# Patient Record
Sex: Female | Born: 1960 | Race: White | Hispanic: No | Marital: Married | Smoking: Current every day smoker
Health system: Southern US, Community
[De-identification: ages and names within clinical notes are randomized; demographics above are authoritative.]

## PROBLEM LIST (undated history)

## (undated) DIAGNOSIS — F329 Major depressive disorder, single episode, unspecified: Secondary | ICD-10-CM

## (undated) DIAGNOSIS — Z972 Presence of dental prosthetic device (complete) (partial): Secondary | ICD-10-CM

## (undated) DIAGNOSIS — K08109 Complete loss of teeth, unspecified cause, unspecified class: Secondary | ICD-10-CM

## (undated) DIAGNOSIS — F32A Depression, unspecified: Secondary | ICD-10-CM

## (undated) DIAGNOSIS — M549 Dorsalgia, unspecified: Secondary | ICD-10-CM

## (undated) HISTORY — DX: Major depressive disorder, single episode, unspecified: F32.9

## (undated) HISTORY — DX: Complete loss of teeth, unspecified cause, unspecified class: K08.109

## (undated) HISTORY — DX: Presence of dental prosthetic device (complete) (partial): Z97.2

## (undated) HISTORY — DX: Dorsalgia, unspecified: M54.9

## (undated) HISTORY — PX: ABDOMINAL HYSTERECTOMY: SHX81

## (undated) HISTORY — DX: Depression, unspecified: F32.A

## (undated) HISTORY — PX: WOUND DEBRIDEMENT: SHX247

---
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
## 1991-07-26 HISTORY — PX: TUBAL LIGATION: SHX77

## 1991-07-26 HISTORY — PX: BREAST ENHANCEMENT SURGERY: SHX7

## 1993-07-25 HISTORY — PX: OVARIAN CYST SURGERY: SHX726

## 1998-04-23 ENCOUNTER — Other Ambulatory Visit: Admission: RE | Admit: 1998-04-23 | Discharge: 1998-04-23 | Payer: Self-pay | Admitting: Obstetrics and Gynecology

## 1998-07-21 ENCOUNTER — Inpatient Hospital Stay (HOSPITAL_COMMUNITY): Admission: AD | Admit: 1998-07-21 | Discharge: 1998-07-22 | Payer: Self-pay | Admitting: Obstetrics and Gynecology

## 1999-05-31 ENCOUNTER — Other Ambulatory Visit: Admission: RE | Admit: 1999-05-31 | Discharge: 1999-05-31 | Payer: Self-pay | Admitting: Obstetrics and Gynecology

## 2000-01-20 ENCOUNTER — Other Ambulatory Visit: Admission: RE | Admit: 2000-01-20 | Discharge: 2000-01-20 | Payer: Self-pay | Admitting: Obstetrics and Gynecology

## 2001-03-19 ENCOUNTER — Other Ambulatory Visit: Admission: RE | Admit: 2001-03-19 | Discharge: 2001-03-19 | Payer: Self-pay | Admitting: Obstetrics and Gynecology

## 2002-05-08 ENCOUNTER — Other Ambulatory Visit: Admission: RE | Admit: 2002-05-08 | Discharge: 2002-05-08 | Payer: Self-pay | Admitting: Obstetrics and Gynecology

## 2009-02-18 ENCOUNTER — Ambulatory Visit: Payer: Self-pay | Admitting: Family Medicine

## 2009-02-18 DIAGNOSIS — F17200 Nicotine dependence, unspecified, uncomplicated: Secondary | ICD-10-CM | POA: Insufficient documentation

## 2009-02-18 DIAGNOSIS — G894 Chronic pain syndrome: Secondary | ICD-10-CM | POA: Insufficient documentation

## 2009-02-19 LAB — CONVERTED CEMR LAB
AST: 15 units/L (ref 0–37)
Albumin: 4.4 g/dL (ref 3.5–5.2)
Alkaline Phosphatase: 68 units/L (ref 39–117)
BUN: 8 mg/dL (ref 6–23)
Calcium: 9.7 mg/dL (ref 8.4–10.5)
Chloride: 105 meq/L (ref 96–112)
Potassium: 4.3 meq/L (ref 3.5–5.3)
Sodium: 140 meq/L (ref 135–145)
Total Protein: 7 g/dL (ref 6.0–8.3)

## 2009-03-03 ENCOUNTER — Encounter: Admission: RE | Admit: 2009-03-03 | Discharge: 2009-03-03 | Payer: Self-pay | Admitting: Family Medicine

## 2009-03-03 LAB — HM MAMMOGRAPHY: HM Mammogram: NEGATIVE

## 2009-03-12 ENCOUNTER — Ambulatory Visit: Payer: Self-pay | Admitting: Family Medicine

## 2009-04-03 ENCOUNTER — Ambulatory Visit: Payer: Self-pay | Admitting: Family Medicine

## 2009-05-06 ENCOUNTER — Ambulatory Visit: Payer: Self-pay | Admitting: Family Medicine

## 2009-05-15 DIAGNOSIS — E781 Pure hyperglyceridemia: Secondary | ICD-10-CM | POA: Insufficient documentation

## 2009-06-05 ENCOUNTER — Telehealth: Payer: Self-pay | Admitting: Family Medicine

## 2009-07-31 ENCOUNTER — Ambulatory Visit: Payer: Self-pay | Admitting: Family Medicine

## 2009-08-02 ENCOUNTER — Encounter: Payer: Self-pay | Admitting: Family Medicine

## 2009-11-17 ENCOUNTER — Ambulatory Visit: Payer: Self-pay | Admitting: Family Medicine

## 2009-11-17 DIAGNOSIS — F39 Unspecified mood [affective] disorder: Secondary | ICD-10-CM | POA: Insufficient documentation

## 2009-11-17 LAB — CONVERTED CEMR LAB
ALT: 12 units/L (ref 0–35)
AST: 17 units/L (ref 0–37)
Albumin: 4.4 g/dL (ref 3.5–5.2)
Alkaline Phosphatase: 59 units/L (ref 39–117)
BUN: 11 mg/dL (ref 6–23)
Calcium: 9.1 mg/dL (ref 8.4–10.5)
Chloride: 103 meq/L (ref 96–112)
Estradiol: 81.5 pg/mL
FSH: 87.2 milliintl units/mL
HDL goal, serum: 40 mg/dL
HDL: 90 mg/dL (ref >39)
Hemoglobin: 13.7 g/dL (ref 12.0–15.0)
LDL Cholesterol: 129 mg/dL — ABNORMAL HIGH (ref 0–99)
LH: 42.2 milliintl units/mL
MCHC: 34.6 g/dL (ref 30.0–36.0)
Platelets: 268 10*3/uL (ref 150–400)
Potassium: 4.7 meq/L (ref 3.5–5.3)
Progesterone: <0.2 ng/mL
RDW: 13.5 % (ref 11.5–15.5)
Sodium: 137 meq/L (ref 135–145)
TSH: 1.569 microintl units/mL (ref 0.350–4.500)
Total Protein: 7 g/dL (ref 6.0–8.3)

## 2009-11-18 ENCOUNTER — Encounter: Payer: Self-pay | Admitting: Family Medicine

## 2010-03-18 ENCOUNTER — Ambulatory Visit: Payer: Self-pay | Admitting: Family Medicine

## 2010-03-18 LAB — CONVERTED CEMR LAB
Glucose, Urine, Semiquant: NEGATIVE
Ketones, urine, test strip: NEGATIVE
Protein, U semiquant: NEGATIVE
Specific Gravity, Urine: 1.02
WBC Urine, dipstick: NEGATIVE

## 2010-03-26 ENCOUNTER — Telehealth: Payer: Self-pay | Admitting: Family Medicine

## 2010-03-30 ENCOUNTER — Ambulatory Visit: Payer: Self-pay | Admitting: Family Medicine

## 2010-07-28 ENCOUNTER — Ambulatory Visit
Admission: RE | Admit: 2010-07-28 | Discharge: 2010-07-28 | Payer: Self-pay | Source: Home / Self Care | Admitting: Emergency Medicine

## 2010-07-28 ENCOUNTER — Encounter: Payer: Self-pay | Admitting: Emergency Medicine

## 2010-07-28 DIAGNOSIS — M79609 Pain in unspecified limb: Secondary | ICD-10-CM | POA: Insufficient documentation

## 2010-07-31 ENCOUNTER — Ambulatory Visit
Admission: RE | Admit: 2010-07-31 | Discharge: 2010-07-31 | Payer: Self-pay | Source: Home / Self Care | Admitting: Family Medicine

## 2010-08-04 ENCOUNTER — Telehealth (INDEPENDENT_AMBULATORY_CARE_PROVIDER_SITE_OTHER): Payer: Self-pay | Admitting: *Deleted

## 2010-08-25 ENCOUNTER — Ambulatory Visit: Payer: Self-pay | Admitting: Family Medicine

## 2010-08-26 NOTE — Assessment & Plan Note (Signed)
Summary: FU Hip Pain   Vital Signs:  Patient profile:   50 year old female Height:      64.5 inches Weight:      135 pounds Pulse rate:   83 / minute BP sitting:   109 / 68  (right arm) Cuff size:   regular  Vitals Entered By: Avon Gully CMA, Duncan Dull) (March 30, 2010 1:56 PM) CC: f/u hip pain,not in as much pain   Primary Care Provider:  Nani Gasser MD  CC:  f/u hip pain and not in as much pain.  History of Present Illness: f/u hip pain, not in as much pain. Able to move it some. Says she accidently spilled coffe on her meds and ran out early. Also says she wore heels to a friends funeral and the next day was in worse pain.  Says the swelling is some better. Stil using ice moslty but occ using heat. Feels like it is raditing into her buttock and towads her groin.   Current Medications (verified): 1)  Cymbalta 60 Mg Cpep (Duloxetine Hcl) .... Take One Tablet By Mouth Twice A Day 2)  Clonazepam 1 Mg Tbdp (Clonazepam) .... Take One Tablet By Mouth Three Times A Day 3)  Estradiol 1 Mg Tabs (Estradiol) .... Take One Tablet By Mouth At Bedtime 4)  Trazodone Hcl 100 Mg Tabs (Trazodone Hcl) .... Take One Tablet By Mouth At Bedtime 5)  Flexeril 10 Mg Tabs (Cyclobenzaprine Hcl) .... Take 1 Tablet By Mouth Three Times A Day As Needed 6)  Hydrocodone-Acetaminophen 5-325 Mg Tabs (Hydrocodone-Acetaminophen) .Marland Kitchen.. 1-2 Tabs By Mouth Every 6 Hours As Needed For Severe Pain.  Allergies (verified): No Known Drug Allergies  Comments:  Nurse/Medical Assistant: The patient's medications and allergies were reviewed with the patient and were updated in the Medication and Allergy Lists. Avon Gully CMA, Duncan Dull) (March 30, 2010 1:56 PM)  Physical Exam  General:  Well-developed,well-nourished,in no acute distress; alert,appropriate and cooperative throughout examination Msk:  Right lateral hip with still significant swelling and brusing but overall improved. NROM and able to  ambulate today.    Impression & Recommendations:  Problem # 1:  CONTUSION, HIP, RIGHT (ICD-924.01) Improving. REfilled meds with smaller quantities. If not at least 50% better in 2 weeks let me know and will refer to ortho for further evaluation.   Complete Medication List: 1)  Cymbalta 60 Mg Cpep (Duloxetine hcl) .... Take one tablet by mouth twice a day 2)  Clonazepam 1 Mg Tbdp (Clonazepam) .... Take one tablet by mouth three times a day 3)  Estradiol 1 Mg Tabs (Estradiol) .... Take one tablet by mouth at bedtime 4)  Trazodone Hcl 100 Mg Tabs (Trazodone hcl) .... Take one tablet by mouth at bedtime 5)  Flexeril 10 Mg Tabs (Cyclobenzaprine hcl) .... Take 1 tablet by mouth three times a day as needed 6)  Hydrocodone-acetaminophen 5-325 Mg Tabs (Hydrocodone-acetaminophen) .Marland Kitchen.. 1-2 tabs by mouth every 6 hours as needed for severe pain.  Patient Instructions: 1)  Call me if not better in 2 weeks.  Prescriptions: HYDROCODONE-ACETAMINOPHEN 5-325 MG TABS (HYDROCODONE-ACETAMINOPHEN) 1-2 tabs by mouth every 6 hours as needed for severe pain.  #45 x 0   Entered and Authorized by:   Nani Gasser MD   Signed by:   Nani Gasser MD on 03/30/2010   Method used:   Printed then faxed to ...       [facility] (retail)       [address]  [place], [place]  [address]       Ph: [phone]       Fax: [phone]   RxID:   [id number] FLEXERIL 10 MG TABS (CYCLOBENZAPRINE HCL) Take 1 tablet by mouth three times a day as needed  #30 x 0   Entered and Authorized by:   Nani Gasser MD   Signed by:   Nani Gasser MD on 03/30/2010   Method used:   Printed then faxed to ...       [facility] (retail)       [address]       [place], [place]  [address]       Ph: [phone]       Fax: [phone]   RxID:   [id number]

## 2010-08-26 NOTE — Progress Notes (Signed)
Summary: pain meds  Phone Note Call from Patient   Caller: Patient Call For: Nani Gasser MD Summary of Call: Pt called and states she had been doing ok with her hip on the pain medications rx'ed. Pt states she had to "attend a girlfriends mother's funeral and had to wear heels and now her hip is hurting." Pt states " her pain level is a ten" Initial call taken by: Avon Gully CMA, Duncan Dull),  March 26, 2010 9:13 AM  Follow-up for Phone Call        Those meds were to last her at least 2 weeks adn I told her that during the OV.  Can use ice packs and rest and IBU. Lets try to get her ED notes. I would like ot see what they rx for her too. I don't see it scanned in.  Follow-up by: Nani Gasser MD,  March 26, 2010 9:44 AM  Additional Follow-up for Phone Call Additional follow up Details #1::        spoke with pt. pt states she was seen at [facility] she has been doing ice and ibuprofen but will continue to do that  Additional Follow-up by: Avon Gully CMA, Duncan Dull),  March 26, 2010 1:19 PM

## 2010-08-26 NOTE — Assessment & Plan Note (Signed)
Summary: F/U LAST VISIT NO CHANGE/TM Room 4   Vital Signs:  Patient Profile:   50 Years Old Female CC:      Rt arm pain x 5 months Height:     64.5 inches Weight:      133 pounds O2 Sat:      99 % O2 treatment:    Room Air Temp:     99.0 degrees F oral Pulse rate:   85 / minute Pulse rhythm:   regular Resp:     16 per minute BP sitting:   114 / 80  (left arm) Cuff size:   regular  Pt. in pain?   yes    Location:   arm    Intensity:   6    Type:       sharp  Vitals Entered By: Emilio Math (July 31, 2010 12:28 PM)                   Current Allergies (reviewed today): No known allergies History of Present Illness Chief Complaint: Rt arm pain x 5 months History of Present Illness:  Subjective:  Patient complains of persistent pain in her right 5th finger radiating to right elbow and even right shoulder (see previous note).  She has an appt with Dr. Oralia Rud on 08/02/10.  She did not like the effects of Nucynta which made her feel  "weird."  The pain awakens her at night. She states that she fell off her horse about 5 months ago and has had gradual increase in pain in her right shoulder and right elbow.  REVIEW OF SYSTEMS Constitutional Symptoms      Denies fever, chills, night sweats, weight loss, weight gain, and fatigue.  Eyes       Denies change in vision, eye pain, eye discharge, glasses, contact lenses, and eye surgery. Ear/Nose/Throat/Mouth       Denies hearing loss/aids, change in hearing, ear pain, ear discharge, dizziness, frequent runny nose, frequent nose bleeds, sinus problems, sore throat, hoarseness, and tooth pain or bleeding.  Respiratory       Denies dry cough, productive cough, wheezing, shortness of breath, asthma, bronchitis, and emphysema/COPD.  Cardiovascular       Denies murmurs, chest pain, and tires easily with exhertion.    Gastrointestinal       Denies stomach pain, nausea/vomiting, diarrhea, constipation, blood in bowel movements,  and indigestion. Genitourniary       Denies painful urination, kidney stones, and loss of urinary control. Neurological       Denies paralysis, seizures, and fainting/blackouts. Musculoskeletal       Complains of muscle pain and joint pain.      Denies joint stiffness, decreased range of motion, redness, swelling, muscle weakness, and gout.  Skin       Denies bruising, unusual mles/lumps or sores, and hair/skin or nail changes.  Psych       Denies mood changes, temper/anger issues, anxiety/stress, speech problems, depression, and sleep problems.  Past History:  Past Medical History: Reviewed history from 07/28/2010 and no changes required. Chronic Back Pain.   Depression  Past Surgical History: Reviewed history from 05/15/2009 and no changes required. Partial Hysterectomyfor bleeding 2000 Brest implants 1993 c/sec Surgical debridement for dog bit on her right 5th digit.  Tonsillectomy 1968 Tubal ligation 1993 Cyst removed from ovary 1995  Family History: Reviewed history from 05/15/2009 and no changes required. 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   Current Smoker Alcohol use-yes Drug use-no Regular exercise-no   Objective:  No acute distress  Right shoulder:  No deformity.  Decreased range of motion:  Has difficulty abducting above horizontal.  Decreased external rotation.  Cannot touch her bra strap behind her with right hand.  Distinct tenderness over insertions of biceps tendons. Right elbow:  No swelling or deformity.  Distinct tenderness over lateral epicondyle.  Palpation there with resisted dorsiflexion of the right wrist causes pain. Right 5th finger (see previous office visit note):  Chronic erythema distal phalanx with mild tenderness to palpation.  Decreased range of motion DIP joint. 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  #100gm x 0, 07/31/2010, Donna Christen MD  New Orders: Ketorolac-Toradol 15mg  [Z6109] Admin of Therapeutic Inj  intramuscular or subcutaneous [96372] Est. Patient Level IV [60454] Tennis Elbow Support [L3701] Planning Comments:   Given Toradol 60mg  IM.  Applied tennis elbow strap right elbow.  Rx for Voltaren Gel to apply to right 5th finger.  Ultracet for bedtime use.  Begin range of motion exercises for right shoulder and right elbow ([facility] information and instruction patient handouts given)  Follow-up with sports med clinic.   The patient and/or caregiver has been counseled thoroughly with regard to medications prescribed including dosage, schedule, interactions, rationale for use, and possible side effects and they verbalize understanding.  Diagnoses and expected course of recovery discussed and will return if not improved as expected or if the condition worsens. Patient and/or caregiver verbalized understanding.  Prescriptions: ULTRACET 37.5-325 MG TABS (TRAMADOL-ACETAMINOPHEN) 1 by mouth hs as needed pain  #10 (ten) x 0   Entered and Authorized by:   Donna Christen MD   Signed by:   Donna Christen MD on 07/31/2010   Method used:   Print then Give to Patient   RxID:   [id number] VOLTAREN 1 % GEL (DICLOFENAC SODIUM) Apply one gm to affected finger three times a day to four times a day.  #100gm x 0   Entered and Authorized by:   Donna Christen MD   Signed by:   Donna Christen MD on 07/31/2010   Method  used:   Print then Give to Patient   RxID:   [id number]   Medication Administration  Injection # 1:    Medication: Ketorolac-Toradol 15mg     Diagnosis: BICEPS TENDINITIS, RIGHT (ICD-726.12)    Route: IM    Site: LUOQ gluteus    Exp Date: 05/26/2011    Lot #: [id number]    Mfr: Novaplus    Comments: 60 mgs given    Patient tolerated injection without complications    Given by: Emilio Math (August 01, 2010 12:55 PM)  Orders Added: 1)  Ketorolac-Toradol 15mg  [J1885] 2)  Admin of Therapeutic Inj  intramuscular or subcutaneous [96372] 3)  Est. Patient Level IV [41324] 4)  Tennis Elbow Support [L3701]     Medication Administration  Injection # 1:    Medication: Ketorolac-Toradol 15mg   Diagnosis: BICEPS TENDINITIS, RIGHT (ICD-726.12)    Route: IM    Site: LUOQ gluteus    Exp Date: 05/26/2011    Lot #: [id number]    Mfr: Novaplus    Comments: 60 mgs given    Patient tolerated injection without complications    Given by: Emilio Math (August 01, 2010 12:55 PM)  Orders Added: 1)  Ketorolac-Toradol 15mg  [J1885] 2)  Admin of Therapeutic Inj  intramuscular or subcutaneous [96372] 3)  Est. Patient Level IV [04540] 4)  Tennis Elbow Support [L3701]

## 2010-08-26 NOTE — Progress Notes (Signed)
  Phone Note Outgoing Call Call back at [facility] Phone [phone]   Call placed by: Emilio Math,  August 04, 2010 2:55 PM Summary of Call: Called pt left msg

## 2010-08-26 NOTE — Letter (Signed)
Summary: CONTROLLED RX POLCY  CONTROLLED RX POLCY   Imported By: Dannette Barbara 07/28/2010 17:24:05  _____________________________________________________________________  External Attachment:    Type:   Image     Comment:   External Document

## 2010-08-26 NOTE — Assessment & Plan Note (Signed)
Summary: RT ARM PAIN,NUMBNESS,AND TINGLING/WSE   Vital Signs:  Patient Profile:   50 Years Old Female CC:      right hand and arm pain/ numbness Height:     64.5 inches Weight:      133 pounds O2 Sat:      99 % O2 treatment:    Room Air Temp:     98.2 degrees F oral Pulse rate:   78 / minute Resp:     16 per minute BP sitting:   104 / 69  (left arm) Cuff size:   regular  Pt. in pain?   yes    Location:   right hand    Intensity:   6    Type:       throbbing  Vitals Entered By: Lajean Saver RN (July 28, 2010 12:40 PM)                   Updated Prior Medication List: CYMBALTA 60 MG CPEP (DULOXETINE HCL) Take one tablet by mouth twice a day ESTRADIOL 1 MG TABS (ESTRADIOL) Take one tablet by mouth at bedtime TRAZODONE HCL 100 MG TABS (TRAZODONE HCL) Take one tablet by mouth at bedtime FLEXERIL 10 MG TABS (CYCLOBENZAPRINE HCL) Take 1 tablet by mouth three times a day as needed HYDROCODONE-ACETAMINOPHEN 5-325 MG TABS (HYDROCODONE-ACETAMINOPHEN) 1-2 tabs by mouth every 6 hours as needed for severe pain. VENTOLIN HFA 108 (90 BASE) MCG/ACT AERS (ALBUTEROL SULFATE) 2 - 4 puffs every 4 hours as needed for SOB. KLONOPIN 0.5 MG TABS (CLONAZEPAM) tid  Current Allergies: No known allergies History of Present Illness History from: patient Chief Complaint: right hand and arm pain/ numbness History of Present Illness: She was bit by a dog last spring and had to have surgery on her R pinky finger by Dr. Oralia Rud at [facility].  Ever since then, she has had terrible pain that is constant.  She stated that she is able to deal with it in the past, but now it is radiating up her entire arm and is affected her R thumb as well.  She has tried many NSAIDs but it is not helping.  Ice doesn't help.  No further injury.  She reports redness, swelling, and decreased ROM. She has a follow up appt with her Ortho next Monday but "she just can't make it until then" without something to ease the pain.  She  states that weather doesn't affect it but it causes her to cry every day.  REVIEW OF SYSTEMS Constitutional Symptoms      Denies fever, chills, night sweats, weight loss, weight gain, and fatigue.  Eyes       Denies change in vision, eye pain, eye discharge, glasses, contact lenses, and eye surgery. Ear/Nose/Throat/Mouth       Denies hearing loss/aids, change in hearing, ear pain, ear discharge, dizziness, frequent runny nose, frequent nose bleeds, sinus problems, sore throat, hoarseness, and tooth pain or bleeding.  Respiratory       Denies dry cough, productive cough, wheezing, shortness of breath, asthma, bronchitis, and emphysema/COPD.  Cardiovascular       Denies murmurs, chest pain, and tires easily with exhertion.    Gastrointestinal       Denies stomach pain, nausea/vomiting, diarrhea, constipation, blood in bowel movements, and indigestion. Genitourniary       Denies painful urination, kidney stones, and loss of urinary control. Neurological       Complains of numbness and tingling.  Denies paralysis, seizures, and fainting/blackouts.      Comments: right hand Musculoskeletal       Complains of muscle pain, joint pain, joint stiffness, and redness.      Denies decreased range of motion, swelling, muscle weakness, and gout.      Comments: right little finger and right arm Skin       Denies bruising, unusual mles/lumps or sores, and hair/skin or nail changes.  Psych       Denies mood changes, temper/anger issues, anxiety/stress, speech problems, depression, and sleep problems. Other Comments: Patient was bitten by a dog in 11/2009. She had surgery on her hand for the infection. She c/o pain in her hand that travels up her right arm. She also c/o numbness in her little finger and thumb   Past History:  Past Medical History: Chronic Back Pain.   Depression  Past Surgical History: Reviewed history from 05/15/2009 and no changes required. Partial Hysterectomyfor bleeding  2000 Brest implants 1993 c/sec Surgical debridement for dog bit on her right 5th digit.  Tonsillectomy 1968 Tubal ligation 1993 Cyst removed from ovary 1995  Family History: Reviewed history from 05/15/2009 and no changes required. 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 Spurling neg.  No TTP with ulnar nerve irritation at the elbow or wrist.  Her R pinky finger has reduced flexion at PIP and DIP.  She does have mild chronic erythema & swelling distally on her finger compared to the opposite side.  She is hesitant to participate in the examination due to her discomfort, so difficult to assess strength although NV status appears intact. Assessment New Problems: FINGER PAIN (ICD-729.5)   Plan New Medications/Changes: NUCYNTA 50 MG TABS (TAPENTADOL HCL) 1 by mouth q8 hrs as needed for severe pain  #12 x 0, 07/28/2010, Hoyt Koch MD NEURONTIN 300 MG CAPS (GABAPENTIN) 1 three times a day (titrate up to three times a day over 3 days)  #30 x 0, 07/28/2010, Hoyt Koch MD  New Orders: Est. Patient Level III [phone] Planning Comments:   She is to follow up with Dr. Oralia Rud at [facility] in [place] on Jan 9th as scheduled for more definitive treatment.  It's possible that she has some RSD symptoms from her old injury.  This could possible explain an ulnar distribution of the pain including the adductor pollicis on that same hand.  I feel that the appropriate course of action would be to first follow up with her ortho.  Afterwards, either hand therapy +/- neurology appt for EMG/NCV +/- pain  management.  According to the [facility] registry, she has received 11 narcotic Rx (total of #277 tabs) in 2011 and her behavior is somewhat suspicious for drug seeking and some of her pain may be due to underlying depression/anxiety.  Therefore will give her Nucynta for pain which shouldn't give her any narcotic high but will adequately treat pain + Rx for Neurontin to cover any nerve pain.  If her insurance doesn't cover Nucynta, will switch to Ultracet.   The patient and/or caregiver has been counseled thoroughly with regard to medications prescribed including dosage, schedule, interactions, rationale for use, and possible side  effects and they verbalize understanding.  Diagnoses and expected course of recovery discussed and will return if not improved as expected or if the condition worsens. Patient and/or caregiver verbalized understanding.  Prescriptions: NUCYNTA 50 MG TABS (TAPENTADOL HCL) 1 by mouth q8 hrs as needed for severe pain  #12 x 0   Entered and Authorized by:   Hoyt Koch MD   Signed by:   Hoyt Koch MD on 07/28/2010   Method used:   Print then Give to Patient   RxID:   [id number] NEURONTIN 300 MG CAPS (GABAPENTIN) 1 three times a day (titrate up to three times a day over 3 days)  #30 x 0   Entered and Authorized by:   Hoyt Koch MD   Signed by:   Hoyt Koch MD on 07/28/2010   Method used:   Print then Give to Patient   RxID:   [id number]   Orders Added: 1)  Est. Patient Level III [84132]

## 2010-08-26 NOTE — Assessment & Plan Note (Signed)
Summary: MEDS AND LABS   Vital Signs:  Patient profile:   50 year old female Height:      64.5 inches Weight:      139 pounds Pulse rate:   76 / minute BP sitting:   101 / 63  (left arm) Cuff size:   regular  Vitals Entered By: Kathlene November (November 17, 2009 10:22 AM) CC: needs labs done per her psychiatrist for hormone level and TSH   Primary Care Provider:  Nani Gasser MD  CC:  needs labs done per her psychiatrist for hormone level and TSH.  History of Present Illness: needs labs done per her psychiatrist for hormone level and TSH.  Started seeing psychiatrist, Dr. Shelly Coss, MD.  She upped her medications.  Was outpatient for 2 weeks at [facility] for a "mental breakdown" about 2 weeks ago.    Current Medications (verified): 1)  Cymbalta 60 Mg Cpep (Duloxetine Hcl) .... Take One Tablet By Mouth Twice A Day 2)  Clonazepam 1 Mg Tbdp (Clonazepam) .... Take One Tablet By Mouth Three Times A Day 3)  Estradiol 1 Mg Tabs (Estradiol) .... Take One Tablet By Mouth At Bedtime 4)  Trazodone Hcl 100 Mg Tabs (Trazodone Hcl) .... Take One Tablet By Mouth At Bedtime  Allergies (verified): No Known Drug Allergies  Comments:  Nurse/Medical Assistant: The patient's medications and allergies were reviewed with the patient and were updated in the Medication and Allergy Lists. Kathlene November (November 17, 2009 10:24 AM)  Physical Exam  General:  Well-developed,well-nourished,in no acute distress; alert,appropriate and cooperative throughout examination Head:  Normocephalic and atraumatic without obvious abnormalities. No apparent alopecia or balding. Lungs:  Normal respiratory effort, chest expands symmetrically. Lungs are clear to auscultation, no crackles or wheezes. Heart:  Normal rate and regular rhythm. S1 and S2 normal without gallop, murmur, click, rub or other extra sounds. Skin:  no rashes.   Psych:  Cognition and judgment appear intact. Alert and cooperative with normal attention  span and concentration. 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 Lequita Halt at [phone] (fax), [phone](phone) at [facility]. .  Also plans on seeing counselor. Will check her thyroid and hormone levels as well. Will fax copy to Dr. Lequita Halt.  She is overall doing better and seems to really like Dr. Lequita Halt.  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   Orders: T-Lipid Profile ([id number]) T-Comprehensive Metabolic Panel [phone])  Complete Medication List: 1)  Cymbalta 60 Mg Cpep (Duloxetine hcl) .... Take one tablet by mouth twice a day 2)  Clonazepam 1 Mg Tbdp (Clonazepam) .... Take one tablet by mouth three times a day 3)  Estradiol 1 Mg Tabs (Estradiol) .... Take one tablet by mouth at bedtime 4)  Trazodone Hcl 100 Mg Tabs (Trazodone hcl) .... Take one tablet by mouth at bedtime  Patient Instructions: 1)  Please schedule a follow-up appointment in 3 months .

## 2010-08-26 NOTE — Assessment & Plan Note (Signed)
Summary: was seen ED For a bruised  hip, fell off horse   Vital Signs:  Patient profile:   50 year old female Height:      64.5 inches Weight:      136 pounds BMI:     23.07 Pulse rate:   90 / minute BP sitting:   122 / 76  (left arm) Cuff size:   regular  Vitals Entered By: Avon Gully CMA, Duncan Dull) (March 18, 2010 1:36 PM) CC: fell off of  a horse tues , injured rt hip, very bruised and painful   Primary Care Nikcole Eischeid:  Nani Gasser MD  CC:  fell off of  a horse tues , injured rt hip, and very bruised and painful.  History of Present Illness: fell off of  a horse tues , injured rt hip, very bruised and painful.  Was bucked off the horse.  Landed on steel bars on a fence.  Noticed swelling immediately but was able to stand adn walk. Then tried to get in the car and drive and couldn't.  Went to [facility] adn they did xrays -these were neg for fracture.  Feel the appearance  is 80% better but still having alot of pain.  For pain control using flexeril and hydrocodone.  Says almost out of medications.  Can't sleep on that side. Has been icing it.  Worse to stand, better at rest.   Current Medications (verified): 1)  Cymbalta 60 Mg Cpep (Duloxetine Hcl) .... Take One Tablet By Mouth Twice A Day 2)  Clonazepam 1 Mg Tbdp (Clonazepam) .... Take One Tablet By Mouth Three Times A Day 3)  Estradiol 1 Mg Tabs (Estradiol) .... Take One Tablet By Mouth At Bedtime 4)  Trazodone Hcl 100 Mg Tabs (Trazodone Hcl) .... Take One Tablet By Mouth At Bedtime  Allergies (verified): No Known Drug Allergies  Comments:  Nurse/Medical Assistant: The patient's medications and allergies were reviewed with the patient and were updated in the Medication and Allergy Lists. Avon Gully CMA, Duncan Dull) (March 18, 2010 1:37 PM)  Physical Exam  General:  Well-developed,well-nourished,in no acute distress; alert,appropriate and cooperative throughout examination. Sitting in a wheelchair.  Msk:   Appears to be in significant discomfort with standing.  Flexion to about 35 degrees and extension to about 20 degrees in teh right hip before signicant pain. She has a larger hematoma that is abou 10 inches wide and it warm to touch and indurated on her right hip. Very tender to touch . Has a smaller bruise above aen below the knee on that side with a few small superficial abrasions.   Extremities:  No LE edema.    Impression & Recommendations:  Problem # 1:  CONTUSION, HIP, RIGHT (ICD-924.01) Discused dx with patient that this is a soft tissue injury and likely will take 4 weeks to heel. Did refill her pain  meds for now and will get notes form [facility] ED. F/U in 2 weeks to make sure she is healing well and doesn' tneed repeat hip xrays. Encouraged her to cotinugin icing the area for a few more days adn then move to heat for comfort. Work on ROM.   Problem # 2:  URINARY FREQUENCY (ICD-788.41) Some frequency the last few days with no hematuria, dysuria or abdominal pain.  UA is neg. Follow for new sxs.  Orders: UA Dipstick w/o Micro (automated)  (81003)  Complete Medication List: 1)  Cymbalta 60 Mg Cpep (Duloxetine hcl) .... Take one tablet by mouth twice  a day 2)  Clonazepam 1 Mg Tbdp (Clonazepam) .... Take one tablet by mouth three times a day 3)  Estradiol 1 Mg Tabs (Estradiol) .... Take one tablet by mouth at bedtime 4)  Trazodone Hcl 100 Mg Tabs (Trazodone hcl) .... Take one tablet by mouth at bedtime 5)  Flexeril 10 Mg Tabs (Cyclobenzaprine hcl) .... Take 1 tablet by mouth three times a day as needed 6)  Hydrocodone-acetaminophen 5-325 Mg Tabs (Hydrocodone-acetaminophen) .Marland Kitchen.. 1-2 tabs by mouth every 6 hours as needed for severe pain.  Patient Instructions: 1)  REfilled your pain medications and your muscle relaxer.   2)  Please schedule a follow-up appointment in 2 weeks to recheck your hip.  Prescriptions: HYDROCODONE-ACETAMINOPHEN 5-325 MG TABS (HYDROCODONE-ACETAMINOPHEN) 1-2 tabs  by mouth every 6 hours as needed for severe pain.  #60 x 0   Entered and Authorized by:   Nani Gasser MD   Signed by:   Nani Gasser MD on 03/18/2010   Method used:   Printed then faxed to ...       [facility] (retail)       [address]       [place], [place]  [address]       Ph: [phone]       Fax: [phone]   RxID:   [id number] FLEXERIL 10 MG TABS (CYCLOBENZAPRINE HCL) Take 1 tablet by mouth three times a day as needed  #45 x 0   Entered and Authorized by:   Nani Gasser MD   Signed by:   Nani Gasser MD on 03/18/2010   Method used:   Printed then faxed to ...       [facility] (retail)       [address]       [place], [place]  [address]       Ph: [phone]       Fax: [phone]   RxID:   [id number]   Laboratory Results   Urine Tests  Date/Time Received: 03/18/2010 Date/Time Reported: 03/18/2010  Routine Urinalysis   Color: yellow Appearance: Clear Glucose: negative   (Normal Range: Negative) Bilirubin: negative   (Normal Range: Negative) Ketone: negative   (Normal Range: Negative) Spec. Gravity: 1.020   (Normal Range: 1.003-1.035) Blood: negative   (Normal Range: Negative) pH: 7.0   (Normal Range: 5.0-8.0) Protein: negative   (Normal Range: Negative) Urobilinogen: 0.2   (Normal Range: 0-1) Nitrite: negative   (Normal Range: Negative) Leukocyte Esterace: negative   (Normal Range: Negative)

## 2010-08-26 NOTE — Assessment & Plan Note (Signed)
Summary: Burn behind ear x 2 weeks rm 3   Vital Signs:  Patient Profile:   50 Years Old Female CC:      Burn behind ear x 2 weeks - infected Height:     64.5 inches Weight:      142 pounds O2 Sat:      100 % O2 treatment:    Room Air Temp:     97.2 degrees F oral Pulse rate:   66 / minute Pulse rhythm:   regular Resp:     16 per minute BP sitting:   113 / 70  (right arm) Cuff size:   regular  Vitals Entered By: Areta Haber CMA (July 31, 2009 2:34 PM)                  Current Allergies: No known allergies History of Present Illness Chief Complaint: Burn behind ear x 2 weeks - infected History of Present Illness: Subjective:  Patient complains of a burn behind her right ear about 2 weeks ago from a heating pad.  She has had persistent pain and swelling in the area now with soreness in her right neck.  She has had small amounts of dreainage from the lesion.  The pain is worse at night.  No fevers, chills, and sweats.  She feels well.  Current Problems: CELLULITIS, SCALP (ICD-682.8) HYPERTRIGLYCERIDEMIA (ICD-272.1) TOBACCO ABUSE (ICD-305.1) CHRONIC PAIN SYNDROME (ICD-338.4)   Current Meds CYMBALTA 60 MG CPEP (DULOXETINE HCL) Take one tablet by mouth once a day CLONAZEPAM 1 MG TBDP (CLONAZEPAM) Take one tablet by mouth three times a day ESTRADIOL 1 MG TABS (ESTRADIOL) Take one tablet by mouth at bedtime MELOXICAM 15 MG TABS (MELOXICAM) Take one tablet by mouth oncea day as needed FLEXERIL 10 MG TABS (CYCLOBENZAPRINE HCL) take one tablet by mouth at bedtime HYDROCODONE-ACETAMINOPHEN 5-500 MG TABS (HYDROCODONE-ACETAMINOPHEN) Take one tablet by mouth three times a day as needed PREVACID 15 MG CPDR (LANSOPRAZOLE) Take one tablet by mouth once a day L-ARGININE 1000 MG TABS (ARGININE) Take one tablet by mouth once a day ZINC 50 MG TABS (ZINC) Take one tablet by mouth once a day MULTIVITAMINS  CAPS (MULTIPLE VITAMIN) Take one tablet by mouth once a day VITAMIN C 500 MG  TABS (ASCORBIC ACID) Take one tablet by mouth once a day MINOCYCLINE HCL 100 MG CAPS (MINOCYCLINE HCL) 1 by mouth two times a day DIFLUCAN 150 MG TABS (FLUCONAZOLE) One by mouth as a single dose LORTAB 5 5-500 MG TABS (HYDROCODONE-ACETAMINOPHEN) One or two tabs by mouth hs as needed pain  REVIEW OF SYSTEMS Constitutional Symptoms      Denies fever, chills, night sweats, weight loss, weight gain, and fatigue.  Eyes       Denies change in vision, eye pain, eye discharge, glasses, contact lenses, and eye surgery. Ear/Nose/Throat/Mouth       Denies hearing loss/aids, change in hearing, ear pain, ear discharge, dizziness, frequent runny nose, frequent nose bleeds, sinus problems, sore throat, hoarseness, and tooth pain or bleeding.  Respiratory       Denies dry cough, productive cough, wheezing, shortness of breath, asthma, bronchitis, and emphysema/COPD.  Cardiovascular       Denies murmurs, chest pain, and tires easily with exhertion.    Gastrointestinal       Denies stomach pain, nausea/vomiting, diarrhea, constipation, blood in bowel movements, and indigestion. Genitourniary       Denies painful urination, kidney stones, and loss of urinary control. Neurological  Denies paralysis, seizures, and fainting/blackouts. Musculoskeletal       Denies muscle pain, joint pain, joint stiffness, decreased range of motion, redness, swelling, muscle weakness, and gout.  Skin       Denies bruising, unusual mles/lumps or sores, and hair/skin or nail changes.      Comments: burn behind ear x wks Psych       Denies mood changes, temper/anger issues, anxiety/stress, speech problems, depression, and sleep problems. Other Comments: Pt has not seen PCP for this.    Objective:  No acute distress  Eyes:  Pupils are equal, round, and reactive to light and accomdation.  Extraocular movement is intact.  Conjunctivae are not inflamed.  Ears:  Canals normal.  Tympanic membranes normal.   Nose:  No sinus  tenderness Pharynx:  Normal  Head:  2cm by 3cm erythematous tender lesion behind her right ear with central eschar.  Not fluctuant.  No drainage noted Neck:  Tender shotty right post-auricular and posterior cervical nodes. Assessment New Problems: CELLULITIS, SCALP (ICD-682.8)   Plan New Medications/Changes: LORTAB 5 5-500 MG TABS (HYDROCODONE-ACETAMINOPHEN) One or two tabs by mouth hs as needed pain  #12 (twelve) x 0, 07/31/2009, Donna Christen MD DIFLUCAN 150 MG TABS (FLUCONAZOLE) One by mouth as a single dose  #1 x 0, 07/31/2009, Donna Christen MD MINOCYCLINE HCL 100 MG CAPS (MINOCYCLINE HCL) 1 by mouth two times a day  #20 x 0, 07/31/2009, Donna Christen MD  New Orders: T-Culture, Wound [[id number]] New Patient Level III [30865] Planning Comments:   Wound culture taken.  Begin Minocycline.  May take Diflucan if vaginal yeast infection develops.  Analgesic for bedtime.  May take two Aleve tabs two times a day  Return for worsening symptoms or if not resolved 7 to 10 days.   The patient and/or caregiver has been counseled thoroughly with regard to medications prescribed including dosage, schedule, interactions, rationale for use, and possible side effects and they verbalize understanding.  Diagnoses and expected course of recovery discussed and will return if not improved as expected or if the condition worsens. Patient and/or caregiver verbalized understanding.  Prescriptions: LORTAB 5 5-500 MG TABS (HYDROCODONE-ACETAMINOPHEN) One or two tabs by mouth hs as needed pain  #12 (twelve) x 0   Entered and Authorized by:   Donna Christen MD   Signed by:   Donna Christen MD on 07/31/2009   Method used:   Print then Give to Patient   RxID:   [id number] DIFLUCAN 150 MG TABS (FLUCONAZOLE) One by mouth as a single dose  #1 x 0   Entered and Authorized by:   Donna Christen MD   Signed by:   Donna Christen MD on 07/31/2009   Method used:   Print then Give to Patient   RxID:    [id number] MINOCYCLINE HCL 100 MG CAPS (MINOCYCLINE HCL) 1 by mouth two times a day  #20 x 0   Entered and Authorized by:   Donna Christen MD   Signed by:   Donna Christen MD on 07/31/2009   Method used:   Print then Give to Patient   RxID:   [id number]

## 2010-09-09 ENCOUNTER — Telehealth: Payer: Self-pay | Admitting: Family Medicine

## 2010-09-21 NOTE — Progress Notes (Addendum)
Summary: [facility]-Smoking cessation program  Phone Note Call from Patient Call back at Home Phone [phone]   Caller: Patient Call For: Nani Gasser MD Summary of Call: pt has questions about smoking cessation program.  LM to [facility] at home number   [facility], classes and support groups, search by Tobacco Cessation in Category call [phone] or [phone] Initial call taken by: Francee Piccolo CMA Duncan Dull),  September 09, 2010 3:19 PM  Follow-up for Phone Call        LM to [facility] at home number Francee Piccolo CMA Duncan Dull)  September 14, 2010 10:29 AM   No return call, ending follow up. Follow-up by: Francee Piccolo CMA Duncan Dull),  September 17, 2010 4:39 PM

## 2010-09-30 ENCOUNTER — Encounter: Payer: Self-pay | Admitting: Family Medicine

## 2010-09-30 ENCOUNTER — Ambulatory Visit (INDEPENDENT_AMBULATORY_CARE_PROVIDER_SITE_OTHER): Payer: BC Managed Care – PPO | Admitting: Family Medicine

## 2010-09-30 DIAGNOSIS — J209 Acute bronchitis, unspecified: Secondary | ICD-10-CM

## 2010-10-01 ENCOUNTER — Telehealth: Payer: Self-pay | Admitting: Family Medicine

## 2010-10-05 NOTE — Progress Notes (Signed)
Summary: Cough med  Phone Note Call from Patient   Caller: Patient Call For: Nani Gasser MD Summary of Call: Pt called and statse she did not sleep at all last pm because of coughing and wants cough med called into pharm Initial call taken by: Avon Gully CMA, Duncan Dull),  October 01, 2010 2:59 PM  Follow-up for Phone Call        Rx Called In Follow-up by: Nani Gasser MD,  October 01, 2010 3:15 PM    New/Updated Medications: TESSALON PERLES 100 MG CAPS (BENZONATATE) one by mouth up to three times a day as needed cough Prescriptions: TESSALON PERLES 100 MG CAPS (BENZONATATE) one by mouth up to three times a day as needed cough  #45 x 0   Entered and Authorized by:   Nani Gasser MD   Signed by:   Nani Gasser MD on 10/01/2010   Method used:   Electronically to        [facility]* (retail)       [address]       [place], [place]  [address]       Ph: [phone]       Fax: [phone]   RxID:   [id number]

## 2010-10-05 NOTE — Assessment & Plan Note (Signed)
Summary: Acute bronchitis, wants to quit smoking   Vital Signs:  Patient profile:   50 year old female Height:      64.5 inches Weight:      133 pounds O2 Sat:      99 % on Room air Temp:     98.2 degrees F oral Pulse rate:   73 / minute BP sitting:   114 / 79  (right arm) Cuff size:   regular  Vitals Entered By: Avon Gully CMA, Duncan Dull) (September 30, 2010 9:40 AM)  O2 Flow:  Room air  Serial Vital Signs/Assessments:                                PEF    PreRx  PostRx Time      O2 Sat  O2 Type     L/min  L/min  L/min   By 11:36 AM                      230    230    200     Andrea McCrimmon CMA, (AAMA)  Comments: 11:36 AM pt is in the yellow zone By: Avon Gully CMA, (AAMA)   CC: coughing and fatige,congestion   Primary Care Provider:  Nani Gasser MD  CC:  coughing and fatige and congestion.  History of Present Illness: Start feeling bad about 6 days ago. Runny nose.  Cough and now lower lungs have been hurting. Has been wheezing at night.  Very dry cough.  + chills.  Earache. No nasal congestion. NO ST ot GI sxs. No OTC meds.   She would like to quit smoking. She would like to quit on her 50th birthday.  She brought a booklet from [employer].  She is interested in the nicotine inhaler. Will need a rx for it.  She has tried to quit before with teh program but didnt get very far. Her spouse doesn't smoke.   Current Medications (verified): 1)  Cymbalta 60 Mg Cpep (Duloxetine Hcl) .... Take One Tablet By Mouth Twice A Day 2)  Estradiol 1 Mg Tabs (Estradiol) .... Take One Tablet By Mouth At Bedtime 3)  Trazodone Hcl 100 Mg Tabs (Trazodone Hcl) .... Take One Tablet By Mouth At Bedtime 4)  Klonopin 0.5 Mg Tabs (Clonazepam) .... Tid 5)  Neurontin 300 Mg Caps (Gabapentin) .Marland Kitchen.. 1 Three Times A Day (Titrate Up To Three Times A Day Over 3 Days)  Allergies (verified): No Known Drug Allergies  Comments:  Nurse/Medical Assistant: The patient's medications and  allergies were reviewed with the patient and were updated in the Medication and Allergy Lists. 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   Current Smoker Alcohol use-yes Drug use-no Regular exercise-no  Physical Exam  General:  Well-developed,well-nourished,in no acute distress; alert,appropriate and cooperative throughout examination Head:  Normocephalic and atraumatic without obvious abnormalities. No apparent alopecia or balding. Eyes:  No corneal or conjunctival inflammation noted. EOMI. Perrla. Ears:  External ear exam shows no significant lesions or deformities.  Otoscopic examination reveals clear canals, tympanic membranes are intact bilaterally without bulging, retraction, inflammation or discharge. Hearing is grossly normal bilaterally. Nose:  External nasal examination shows no deformity or inflammation. Nasal mucosa are pink and moist without lesions or exudates. Mouth:  Oral mucosa and oropharynx without lesions  or exudates.  Teeth in good repair. Neck:  No deformities, masses, or tenderness noted. Lungs:  Expiratory wheezing.  GOod effort. No crackles.   Heart:  Normal rate and regular rhythm. S1 and S2 normal without gallop, murmur, click, rub or other extra sounds. Skin:  no rashes.   Cervical Nodes:  No lymphadenopathy noted Psych:  Cognition and judgment appear intact. Alert and cooperative with normal attention span and concentration. No apparent delusions, illusions, hallucinations   Impression & Recommendations:  Problem # 1:  BRONCHITIS- ACUTE (ICD-466.0)  I suspect she underlying COPD. Her peak flow was in the yellow zone today. Given NEB tx here in the office Will treat with 10 days of doxy. ONce better, schedule for spirometry for further evaluation.  Her updated medication list for this problem includes:    Doxycycline Hyclate 100 Mg Tabs (Doxycycline  hyclate) .Marland Kitchen... Take 1 tablet by mouth two times a day for 10 days    Ventolin Hfa 108 (90 Base) Mcg/act Aers (Albuterol sulfate) .Marland Kitchen... 2 puffs inhaled three times a day for shortness of breath or wheezing  Orders: Peak Flow Rate ([address]) Nebulizer Tx ([address]) Albuterol Sulfate Sol 1mg  unit dose (U0454)  Problem # 2:  TOBACCO ABUSE (ICD-305.1) Dsicused smoking cessation. It hinkthe [facility] program is great. She  is going to talk wiht a rep there and decide which nicotine replacement method she would like and call me me back.   Complete Medication List: 1)  Cymbalta 60 Mg Cpep (Duloxetine hcl) .... Take one tablet by mouth twice a day 2)  Estradiol 1 Mg Tabs (Estradiol) .... Take one tablet by mouth at bedtime 3)  Trazodone Hcl 100 Mg Tabs (Trazodone hcl) .... Take one tablet by mouth at bedtime 4)  Klonopin 0.5 Mg Tabs (Clonazepam) .... Tid 5)  Neurontin 300 Mg Caps (Gabapentin) .Marland Kitchen.. 1 three times a day (titrate up to three times a day over 3 days) 6)  Doxycycline Hyclate 100 Mg Tabs (Doxycycline hyclate) .... Take 1 tablet by mouth two times a day for 10 days 7)  Ventolin Hfa 108 (90 Base) Mcg/act Aers (Albuterol sulfate) .... 2 puffs inhaled three times a day for shortness of breath or wheezing  Patient Instructions: 1)  When you are feelikng better let do spirometry( lung test) to check out the shape of your lungs with your years of smoking. 2)  Complete your antibiotic 3)  Call me if you decide you want a rx for nicotine replacment.  Prescriptions: VENTOLIN HFA 108 (90 BASE) MCG/ACT AERS (ALBUTEROL SULFATE) 2 puffs inhaled three times a day for shortness of breath or wheezing  #1 x 0   Entered and Authorized by:   Nani Gasser MD   Signed by:   Nani Gasser MD on 09/30/2010   Method used:   Electronically to        [facility] (retail)       [address]       [place], [place]  [address]       Ph: [phone]       Fax: [phone]   RxID:   [id number] DOXYCYCLINE  HYCLATE 100 MG TABS (DOXYCYCLINE HYCLATE) Take 1 tablet by mouth two times a day for 10 days  #20 x 0   Entered and Authorized by:   Nani Gasser MD   Signed by:   Nani Gasser MD on 09/30/2010   Method used:   Electronically to        [facility]* (retail)  [address]       [place], [place]  [address]       Ph: [phone]       Fax: [phone]   RxID:   [id number]    Medication Administration  Medication # 1:    Medication: Albuterol Sulfate Sol 1mg  unit dose    Diagnosis: BRONCHITIS- ACUTE (ICD-466.0)    Dose: 2.5    Route: inhaled    Patient tolerated medication without complications    Given by: Avon Gully CMA, Duncan Dull) (September 30, 2010 11:37 AM)  Orders Added: 1)  Est. Patient Level IV [84166] 2)  Peak Flow Rate [94150] 3)  Nebulizer Tx [94640] 4)  Albuterol Sulfate Sol 1mg  unit dose [A6301]

## 2010-10-14 ENCOUNTER — Telehealth: Payer: Self-pay | Admitting: *Deleted

## 2010-10-14 DIAGNOSIS — Z720 Tobacco use: Secondary | ICD-10-CM

## 2010-10-14 MED ORDER — NICOTINE 10 MG IN INHA: 1.0000 | RESPIRATORY_TRACT | Status: AC | PRN

## 2010-10-14 NOTE — Telephone Encounter (Signed)
Put in rx but didn't have pharmacy so printed it.

## 2010-10-14 NOTE — Telephone Encounter (Signed)
Pt states she spoke w/ you at last OV about smoking cessation and now is requesting Nicotrol Cartridge Inhaler. Please advise.

## 2010-11-02 ENCOUNTER — Other Ambulatory Visit: Payer: Self-pay | Admitting: Family Medicine

## 2010-12-14 ENCOUNTER — Other Ambulatory Visit: Payer: Self-pay | Admitting: Family Medicine

## 2010-12-20 ENCOUNTER — Inpatient Hospital Stay (INDEPENDENT_AMBULATORY_CARE_PROVIDER_SITE_OTHER)
Admission: RE | Admit: 2010-12-20 | Discharge: 2010-12-20 | Disposition: A | Discharge status: Home / Self Care | Payer: BC Managed Care – PPO | Source: Ambulatory Visit | Attending: Emergency Medicine | Admitting: Emergency Medicine

## 2010-12-20 ENCOUNTER — Encounter: Payer: Self-pay | Admitting: Emergency Medicine

## 2010-12-20 DIAGNOSIS — H9209 Otalgia, unspecified ear: Secondary | ICD-10-CM

## 2010-12-21 ENCOUNTER — Encounter: Payer: Self-pay | Admitting: Family Medicine

## 2010-12-23 ENCOUNTER — Ambulatory Visit (INDEPENDENT_AMBULATORY_CARE_PROVIDER_SITE_OTHER): Payer: BC Managed Care – PPO | Admitting: Family Medicine

## 2010-12-23 ENCOUNTER — Encounter: Payer: Self-pay | Admitting: Family Medicine

## 2010-12-23 DIAGNOSIS — R4586 Emotional lability: Secondary | ICD-10-CM

## 2010-12-23 DIAGNOSIS — R102 Pelvic and perineal pain: Secondary | ICD-10-CM

## 2010-12-23 DIAGNOSIS — Z0000 Encounter for general adult medical examination without abnormal findings: Secondary | ICD-10-CM

## 2010-12-23 DIAGNOSIS — F39 Unspecified mood [affective] disorder: Secondary | ICD-10-CM

## 2010-12-23 DIAGNOSIS — N912 Amenorrhea, unspecified: Secondary | ICD-10-CM

## 2010-12-23 DIAGNOSIS — Z1231 Encounter for screening mammogram for malignant neoplasm of breast: Secondary | ICD-10-CM

## 2010-12-23 DIAGNOSIS — N949 Unspecified condition associated with female genital organs and menstrual cycle: Secondary | ICD-10-CM

## 2010-12-23 DIAGNOSIS — Z1211 Encounter for screening for malignant neoplasm of colon: Secondary | ICD-10-CM

## 2010-12-23 NOTE — Patient Instructions (Signed)
WE will call you with your lab results as soon as we get them back

## 2010-12-23 NOTE — Progress Notes (Signed)
Subjective:    Patient ID: Briana Kim, female    DOB: 14-Jul-1961, 50 y.o.   MRN: [id number]  HPI She is having mood swings and irritability.  She says about a week before she ovulates she feels great and then she ovulates and then she cries and is irritable for 1-2 weeks.  She says even her husband is wanting her to do something about it.  No hot flashes.  She went to [facility] on Monday for an ear ache.  She has TMJ, went to the dentist yesterday.  She has been on ABX for her gums.  She feels swollen on the left side of her neck.  Hasn't been able to wear her dentures. She would like to get her hormones checked.  She is also having R and left lower quadrant program. She is worried she has ovarian cancer.  She has had an hysterectomy.    She is due for her mammogram.  She is due for her colonoscopy.    Review of Systems     Objective:   Physical Exam    She does have a slightly swollen LN on the left side of the neck. No clicking or popping of the TMG.      Assessment & Plan:   Subjective:     Briana Kim is a 50 y.o. female and is here for a comprehensive physical exam. The patient reports see above. Marland Kitchen  History   Social History  . Marital Status: Married    Spouse Name: N/A    Number of Children: N/A  . Years of Education: N/A   Occupational History  . OWNER     Dog groomer   Social History Main Topics  . Smoking status: Current Everyday Smoker  . Smokeless tobacco: Not on file  . Alcohol Use: Yes  . Drug Use: No  . Sexually Active:      dog groomer, owns business, doesn't regularly exercise.   Other Topics Concern  . Not on file   Social History Narrative  . No narrative on file   Health Maintenance  Topic Date Due  . Colonoscopy  11/01/2010  . Mammogram  11/01/2010  . Influenza Vaccine  04/25/2011  . Tetanus/tdap  10/02/2014    The following portions of the patient's history were reviewed and updated as appropriate: allergies, current medications, past family  history, past medical history, past social history and past surgical history.  Review of Systems Pertinent items are noted in HPI.   Objective:    BP 126/77  Pulse 92  Ht 5\' 6"  (1.676 m)  Wt 133 lb (60.328 kg)  BMI 21.47 kg/m2  SpO2 96% General appearance: alert, cooperative and appears stated age Head: Normocephalic, without obvious abnormality, atraumatic Eyes: PEERLA, EOMI, conjuctiva are clear.  Ears: normal TM's and external ear canals both ears Nose: Nares normal. Septum midline. Mucosa normal. No drainage or sinus tenderness. Throat: lips, mucosa, and tongue normal; teeth and gums normal Neck: no adenopathy, no carotid bruit, supple, symmetrical, trachea midline and thyroid not enlarged, symmetric, no tenderness/mass/nodules Back: symmetric, no curvature. ROM normal. No CVA tenderness. Lungs: clear to auscultation bilaterally Breasts: normal appearance, no masses or tenderness Heart: regular rate and rhythm, S1, S2 normal, no murmur, click, rub or gallop Abdomen: soft, non-tender; bowel sounds normal; no masses,  no organomegaly Pelvic: external genitalia normal, no adnexal masses or tenderness, rectovaginal septum normal and vagina normal without discharge Extremities: extremities normal, atraumatic, no cyanosis or edema Pulses: 2+ and symmetric  Skin: Skin color, texture, turgor normal. No rashes or lesions Lymph nodes: Cervical, supraclavicular, and axillary nodes normal. Neurologic: Grossly normal    Assessment:    Healthy female exam. She is very emotional with her ovulation.     Plan:     See After Visit Summary for Counseling Recommendations   Due for screening labs Regular exercise. She is smoking again. Discussed the importance of cessation.  I suspect she is perimenopausal since she is having emotional lability around the time of her periods. We will check her hormone levels and consider HRT. She is already on psych meds and sees psych. She is interested  in me taking over her meds. I explained that I will be happy to do this once she is on a stable regimen and I have notes form her psych.   Schedule mammogram.  Schedule colonoscopy.  Her right breath may have a leaking implant. She says it is saline. Will start with mammogram but may need to get a further evaluation.

## 2010-12-27 ENCOUNTER — Telehealth: Payer: Self-pay | Admitting: Family Medicine

## 2010-12-27 DIAGNOSIS — Z1231 Encounter for screening mammogram for malignant neoplasm of breast: Secondary | ICD-10-CM

## 2010-12-27 NOTE — Telephone Encounter (Signed)
[facility] called and pt had an order entered into "EPIC' and it was sent as screen mammo.  It needs to be changed to digital screen with implants due to the fact that pt has breast implants. Plan:  Routed to provider Dr. Marlyne Beards, LPN [facility]

## 2010-12-27 NOTE — Telephone Encounter (Signed)
Order corrected

## 2010-12-30 ENCOUNTER — Other Ambulatory Visit: Payer: Self-pay | Admitting: Family Medicine

## 2010-12-30 DIAGNOSIS — R102 Pelvic and perineal pain: Secondary | ICD-10-CM

## 2011-01-18 ENCOUNTER — Ambulatory Visit: Payer: BC Managed Care – PPO

## 2011-01-18 ENCOUNTER — Other Ambulatory Visit: Payer: BC Managed Care – PPO

## 2011-01-18 ENCOUNTER — Ambulatory Visit
Admission: RE | Admit: 2011-01-18 | Discharge: 2011-01-18 | Disposition: A | Discharge status: Home / Self Care | Payer: BC Managed Care – PPO | Source: Ambulatory Visit | Attending: Family Medicine | Admitting: Family Medicine

## 2011-01-18 ENCOUNTER — Telehealth: Payer: Self-pay | Admitting: Family Medicine

## 2011-01-18 DIAGNOSIS — Z1231 Encounter for screening mammogram for malignant neoplasm of breast: Secondary | ICD-10-CM

## 2011-01-18 DIAGNOSIS — R102 Pelvic and perineal pain: Secondary | ICD-10-CM

## 2011-01-18 DIAGNOSIS — R4586 Emotional lability: Secondary | ICD-10-CM

## 2011-01-18 NOTE — Telephone Encounter (Signed)
Pt called. Plan:  Call returned but had to [facility] for the pt that the call was returned.  Wants to tell nurse who she wants to do the colonoscopy. Jarvis Newcomer, LPN [facility]

## 2011-01-19 ENCOUNTER — Other Ambulatory Visit: Payer: Self-pay | Admitting: Family Medicine

## 2011-01-19 ENCOUNTER — Telehealth: Payer: Self-pay | Admitting: Family Medicine

## 2011-01-19 DIAGNOSIS — Z1211 Encounter for screening for malignant neoplasm of colon: Secondary | ICD-10-CM

## 2011-01-19 NOTE — Telephone Encounter (Signed)
Patient walk-in office states she needs a colonoscopy done and would like to be referred to Dr. Tami Ribas at [facility].

## 2011-01-20 ENCOUNTER — Telehealth: Payer: Self-pay | Admitting: Family Medicine

## 2011-01-20 LAB — CBC
MCH: 35 pg — ABNORMAL HIGH (ref 26.0–34.0)
MCHC: 32.2 g/dL (ref 30.0–36.0)
Platelets: 298 10*3/uL (ref 150–400)
RDW: 14.9 % (ref 11.5–15.5)

## 2011-01-20 LAB — COMPLETE METABOLIC PANEL WITH GFR
ALT: 14 U/L (ref 0–35)
AST: 20 U/L (ref 0–37)
CO2: 26 mEq/L (ref 19–32)
Chloride: 108 mEq/L (ref 96–112)
Creat: 0.84 mg/dL (ref 0.50–1.10)
Sodium: 141 mEq/L (ref 135–145)
Total Bilirubin: 0.3 mg/dL (ref 0.3–1.2)
Total Protein: 6.6 g/dL (ref 6.0–8.3)

## 2011-01-20 LAB — LIPID PANEL
HDL: 114 mg/dL (ref >39)
LDL Cholesterol: 107 mg/dL — ABNORMAL HIGH (ref 0–99)
Triglycerides: 87 mg/dL (ref <150)
VLDL: 17 mg/dL (ref 0–40)

## 2011-01-20 LAB — FOLLICLE STIMULATING HORMONE: FSH: 62.8 m[IU]/mL

## 2011-01-20 LAB — LUTEINIZING HORMONE: LH: 60.3 m[IU]/mL

## 2011-01-20 LAB — TSH: TSH: 1.81 u[IU]/mL (ref 0.350–4.500)

## 2011-01-20 NOTE — Telephone Encounter (Signed)
Pt advised of all test resuls. Pt to sched OV w/Dr M. Lab  Tests added per Cordelia Pen at [place].

## 2011-01-20 NOTE — Telephone Encounter (Signed)
LM at pt's work to call back re: test results.

## 2011-01-20 NOTE — Telephone Encounter (Signed)
Pls let pt know that her pelvic u/s is normal and her mammogram came back normal.

## 2011-01-20 NOTE — Telephone Encounter (Signed)
Pls let pt know that her sugar, liver and kidney function came back normal.  Cholesterol looks good.  Thyroid function is normal.  Blood counts are normal.  FSH and LH confirm that she is POSTmenopausal.  Her estrogen and progesterone levels are LOW.  Schedule OV with Dr Judie Petit to discuss alternatives to the estrace tab that she is currently taking.    Pls have the lab add on a B12 and Folic Acid.  Dx: macrocytosis.

## 2011-01-20 NOTE — Telephone Encounter (Signed)
Order put in.

## 2011-01-21 ENCOUNTER — Telehealth: Payer: Self-pay | Admitting: Family Medicine

## 2011-01-31 ENCOUNTER — Encounter: Payer: Self-pay | Admitting: Family Medicine

## 2011-01-31 ENCOUNTER — Ambulatory Visit (INDEPENDENT_AMBULATORY_CARE_PROVIDER_SITE_OTHER): Payer: BC Managed Care – PPO | Admitting: Family Medicine

## 2011-01-31 VITALS — BP 136/91 | HR 69 | Ht 64.0 in | Wt 128.0 lb

## 2011-01-31 DIAGNOSIS — R5381 Other malaise: Secondary | ICD-10-CM

## 2011-01-31 DIAGNOSIS — R5383 Other fatigue: Secondary | ICD-10-CM

## 2011-01-31 DIAGNOSIS — M7710 Lateral epicondylitis, unspecified elbow: Secondary | ICD-10-CM

## 2011-01-31 DIAGNOSIS — Z780 Asymptomatic menopausal state: Secondary | ICD-10-CM

## 2011-01-31 MED ORDER — ESTRADIOL 2 MG PO TABS: 2.0000 mg | ORAL_TABLET | Freq: Every day | ORAL | Status: DC

## 2011-01-31 MED ORDER — ESTRADIOL 0.5 MG PO TABS: 0.5000 mg | ORAL_TABLET | Freq: Every day | ORAL | Status: DC

## 2011-01-31 NOTE — Patient Instructions (Addendum)
Recommend tennis elbow.   Use an anti-inflammatory and keep icing.   Can start the exercises and then let me know if not better in 3 weeks.   Will start the hormone therapy. Estradiol once a day. Follow up in 2 weeks.

## 2011-01-31 NOTE — Progress Notes (Signed)
  Subjective:    Patient ID: Briana Kim, female    DOB: [birth year], 50 y.o.   MRN: [id number]  HPI Says her mom became ill last week and she has been very worried about her.  Hurt her right elbow after using a tiller. Has been using an ice pack on it. Injury was last week.  Pain mostly on the outside of the elbow.   Still feeling really emotional at times. Still feels it is hormonal. Now she wonders if her bowels are causing her pelvic pain. She does have her colonoscopy scheduled. Having a lot of pain in his joints pain in her hands. Not lseeping well.  Says her husband doesn't want to be around her when she is like this.  She is on the 1 mg estradiol tab. She is still seeing her psychiatrist and says that he feels that she is well controlled.  She would like to get a DEXA. Family hx of osteoporosis and would like to be checked. Had a parital hysterectomy 14 years ago so not sure when really went through menopause.     Review of Systems     Objective:   Physical Exam  Constitutional: She is oriented to person, place, and time. She appears well-developed and well-nourished.  Cardiovascular: Normal rate, regular rhythm and normal heart sounds.   Pulmonary/Chest: Effort normal and breath sounds normal.  Musculoskeletal:       Right elbow with NROM. Tender over the lateral epicondyle.  Pain with pronatin and supination against resistance.     Neurological: She is alert and oriented to person, place, and time.  Skin: Skin is dry.  Psychiatric: She has a normal mood and affect.          Assessment & Plan:  Menopause - Try 1.5 mg of estradiol daily. If helping some then increase to 2mg  tab. I will go ahead and and get her set up for a DEXA scan as well is a little unclear about how long she's really been postmenopausal since she had a hysterectomy 14 years ago.  Right lateral epicondylitis - Will treat with NSAID, icing, elbow strap, and stretches. H.O. Given. Call if not  improving over the next 3 wk.   Mood disorder-her GAD 7 score is 21 today and her PHQ 9 is 11. I really don't think her mood is well controlled. I explained her that I really want her to get her mood under control with her psychiatrist. Some of this could be normal also we will try increasing her estradiol but I think her underlying mood medication needs to be adjusted as well.

## 2011-02-01 ENCOUNTER — Telehealth: Payer: Self-pay | Admitting: Family Medicine

## 2011-02-01 LAB — VITAMIN D 25 HYDROXY (VIT D DEFICIENCY, FRACTURES): Vit D, 25-Hydroxy: 48 ng/mL (ref 30–89)

## 2011-02-01 NOTE — Telephone Encounter (Signed)
Called patient: Vitamin D and B12 looked good. Please have her call me in about 2-3 weeks after increasing her hormone therapy and let me know she feels it is helping or not. Please ask her she is comfortable with me talking to her psychiatrist about her mood?   if she has the phone number that would be wonderful.

## 2011-02-01 NOTE — Telephone Encounter (Signed)
LM at work for pt to return call.

## 2011-02-07 ENCOUNTER — Inpatient Hospital Stay (INDEPENDENT_AMBULATORY_CARE_PROVIDER_SITE_OTHER)
Admission: RE | Admit: 2011-02-07 | Discharge: 2011-02-07 | Disposition: A | Discharge status: Home / Self Care | Payer: BC Managed Care – PPO | Source: Ambulatory Visit | Attending: Emergency Medicine | Admitting: Emergency Medicine

## 2011-02-07 ENCOUNTER — Encounter: Payer: Self-pay | Admitting: Emergency Medicine

## 2011-02-07 DIAGNOSIS — H60339 Swimmer's ear, unspecified ear: Secondary | ICD-10-CM | POA: Insufficient documentation

## 2011-02-14 ENCOUNTER — Ambulatory Visit: Payer: BC Managed Care – PPO | Admitting: Family Medicine

## 2011-02-15 ENCOUNTER — Ambulatory Visit
Admission: RE | Admit: 2011-02-15 | Discharge: 2011-02-15 | Disposition: A | Discharge status: Home / Self Care | Payer: BC Managed Care – PPO | Source: Ambulatory Visit | Attending: Family Medicine | Admitting: Family Medicine

## 2011-02-15 DIAGNOSIS — R5383 Other fatigue: Secondary | ICD-10-CM

## 2011-02-15 DIAGNOSIS — Z780 Asymptomatic menopausal state: Secondary | ICD-10-CM

## 2011-02-18 ENCOUNTER — Telehealth: Payer: Self-pay | Admitting: Family Medicine

## 2011-02-18 NOTE — Telephone Encounter (Signed)
Called and left message for pt to call back.

## 2011-02-18 NOTE — Telephone Encounter (Signed)
Patient: She does have osteopenia which means slightly thin bones. It is not quite osteoporosis. She definitely needs to make sure she's taking calcium with vitamin D supplement twice a day. Continue her regular exercise which she already has this as well for bones as well. Recheck scan in 2 years.

## 2011-02-21 ENCOUNTER — Ambulatory Visit (INDEPENDENT_AMBULATORY_CARE_PROVIDER_SITE_OTHER): Payer: BC Managed Care – PPO | Admitting: Family Medicine

## 2011-02-21 ENCOUNTER — Encounter: Payer: Self-pay | Admitting: Family Medicine

## 2011-02-21 DIAGNOSIS — M7710 Lateral epicondylitis, unspecified elbow: Secondary | ICD-10-CM

## 2011-02-21 DIAGNOSIS — Z79890 Hormone replacement therapy (postmenopausal): Secondary | ICD-10-CM

## 2011-02-21 DIAGNOSIS — H60339 Swimmer's ear, unspecified ear: Secondary | ICD-10-CM

## 2011-02-21 NOTE — Progress Notes (Signed)
  Subjective:    Patient ID: Briana Kim, female    DOB: 1961/03/21, 50 y.o.   MRN: [id number]  HPI Treated for swimmers ear last week and still using drops. Stil having pain. She is on keflex for her tooth. She is hoping this will help her ear as well. No fever. Ear does feel full.    Her colonosocpy is tomorrow. She is very nervous about his. She has to start the pre today  Still having Right elbow pain. She is frustrated it is not getting better. Has tried the strap and exercises.  Not taking any meds for it now.   Just started the estrace 2mg  tab. She just started them as picked them up last week.  She didn't notice much difference with the 1.5 mg dosing.    Review of Systems     Objective:   Physical Exam  Constitutional: She is oriented to person, place, and time. She appears well-developed and well-nourished.  HENT:  Head: Normocephalic and atraumatic.  Right Ear: External ear normal.  Left Ear: External ear normal.       TMs and canals are clear bilat.   Eyes: EOM are normal.  Cardiovascular: Normal rate, regular rhythm and normal heart sounds.   Pulmonary/Chest: Effort normal and breath sounds normal.  Neurological: She is alert and oriented to person, place, and time.  Skin: Skin is warm and dry.  Psychiatric: She has a normal mood and affect.          Assessment & Plan:  Swimmers ear- Her exam is fairly normal except for an excoriation on the left ear canal.  Complete topical drops and if not better by Wed then call the office.    RT lat epidcondylitis - offered to inject today but she declined. Says maybe another time.  Can continue the strap prn or consider seeing ortho as well.  HRT - will try the 2mg  for another month. If not improving then will drop dose back to 1mg  and focus moe on her mood meds , though she already sees psych.   \ Good luck with colonoscopy tomorrow.  No problem-specific assessment & plan notes found for this encounter.

## 2011-02-21 NOTE — Patient Instructions (Signed)
Call me your ear is still hurting later this week.

## 2011-02-22 NOTE — Telephone Encounter (Signed)
Error

## 2011-03-03 ENCOUNTER — Encounter: Payer: Self-pay | Admitting: Family Medicine

## 2011-03-14 ENCOUNTER — Other Ambulatory Visit: Payer: Self-pay | Admitting: Family Medicine

## 2011-04-04 ENCOUNTER — Telehealth: Payer: Self-pay | Admitting: *Deleted

## 2011-04-04 ENCOUNTER — Telehealth: Payer: Self-pay | Admitting: Family Medicine

## 2011-04-04 DIAGNOSIS — M7710 Lateral epicondylitis, unspecified elbow: Secondary | ICD-10-CM

## 2011-04-04 NOTE — Telephone Encounter (Signed)
Pt notified that the referral member in our office is trying to get her scheduled with ortho for tomorrow for injection and if not Dr. Linford Arnold will see in our office this afternoon for injection.  Victorino Dike in referrals notified as well. Jarvis Newcomer, LPN [facility]

## 2011-04-04 NOTE — Telephone Encounter (Signed)
Pt called and is in tears crying stating her RT arm is worse with the pain #10/10 and she is suppose to get referred and knows she is not going to get an appt with ortho today and knows we are booked.  Please advise since pt states the provider talked about a cortisone shot, but she didn't get the shot when she was in recently.  Since you are booked should we send to [facility] for the cortisone shot???   Plan:  Routed to the provider. Jarvis Newcomer, LPN [facility]

## 2011-04-04 NOTE — Telephone Encounter (Signed)
I called patient and gave her all the appointment info to see Dr. Penni Bombard on Wed. 04-06-11 at 10:45 and patient was aware and fine with this appointment. Thanks, [employer]

## 2011-04-04 NOTE — Telephone Encounter (Signed)
Pt called and wants to know if she can have something for pain until she sees ortho which is Wed am

## 2011-04-04 NOTE — Telephone Encounter (Signed)
We can get in probably tomorrow ith ortho for injection.  Please let Briana Kim know that is needs to be tomorrow. If she feels she can't wait then can double book her for  Injection only (not other issues) for this afternoon.

## 2011-04-05 NOTE — Telephone Encounter (Signed)
Victorino Dike in referrals called the pt and handled her referral appt. Jarvis Newcomer, LPN [facility]

## 2011-04-06 ENCOUNTER — Other Ambulatory Visit: Payer: Self-pay | Admitting: Sports Medicine

## 2011-04-06 ENCOUNTER — Ambulatory Visit
Admission: RE | Admit: 2011-04-06 | Discharge: 2011-04-06 | Disposition: A | Discharge status: Home / Self Care | Payer: BC Managed Care – PPO | Source: Ambulatory Visit | Attending: Sports Medicine | Admitting: Sports Medicine

## 2011-04-06 DIAGNOSIS — M25521 Pain in right elbow: Secondary | ICD-10-CM

## 2011-04-06 DIAGNOSIS — M542 Cervicalgia: Secondary | ICD-10-CM

## 2011-04-14 ENCOUNTER — Other Ambulatory Visit: Payer: Self-pay | Admitting: Sports Medicine

## 2011-04-14 DIAGNOSIS — M542 Cervicalgia: Secondary | ICD-10-CM

## 2011-04-23 ENCOUNTER — Ambulatory Visit
Admission: RE | Admit: 2011-04-23 | Discharge: 2011-04-23 | Disposition: A | Discharge status: Home / Self Care | Payer: BC Managed Care – PPO | Source: Ambulatory Visit | Attending: Sports Medicine | Admitting: Sports Medicine

## 2011-04-23 DIAGNOSIS — M542 Cervicalgia: Secondary | ICD-10-CM

## 2011-06-27 NOTE — Progress Notes (Signed)
Summary: LEFT EAR ACHE/FROM SWIMMING IN THE POOL rm 5   Vital Signs:  Patient Profile:   50 Years Old Female CC:      LT ear pain x 2days Height:     64.5 inches Weight:      132 pounds O2 Sat:      98 % O2 treatment:    Room Air Temp:     98.9 degrees F oral Pulse rate:   66 / minute Resp:     16 per minute BP sitting:   100 / 64  (left arm) Cuff size:   regular  Vitals Entered By: Clemens Catholic LPN (February 07, 2011 11:23 AM)                  Updated Prior Medication List: CYMBALTA 60 MG CPEP (DULOXETINE HCL) Take one tablet by mouth twice a day ESTRADIOL 1 MG TABS (ESTRADIOL) Take one tablet by mouth at bedtime KLONOPIN 0.5 MG TABS (CLONAZEPAM) tid  Current Allergies (reviewed today): No known allergies History of Present Illness History from: patient Chief Complaint: LT ear pain x 2days History of Present Illness: L ear pain for a few days.  She has been swimming a lot recently.  No F/C/N/V.  No other URI symptoms noted.  Pain is located mostly L ear but now starting in her R ear and she can also feel it somewhat on the L side of her neck. She tried a eardrop concoction at home but she doesn't know if it helped.  REVIEW OF SYSTEMS Constitutional Symptoms      Denies fever, chills, night sweats, weight loss, weight gain, and fatigue.  Eyes       Denies change in vision, eye pain, eye discharge, glasses, contact lenses, and eye surgery. Ear/Nose/Throat/Mouth       Complains of ear pain.      Denies hearing loss/aids, change in hearing, ear discharge, dizziness, frequent runny nose, frequent nose bleeds, sinus problems, sore throat, hoarseness, and tooth pain or bleeding.  Respiratory       Denies dry cough, productive cough, wheezing, shortness of breath, asthma, bronchitis, and emphysema/COPD.  Cardiovascular       Denies murmurs, chest pain, and tires easily with exhertion.    Gastrointestinal       Denies stomach pain, nausea/vomiting, diarrhea, constipation,  blood in bowel movements, and indigestion. Genitourniary       Denies painful urination, kidney stones, and loss of urinary control. Neurological       Denies paralysis, seizures, and fainting/blackouts. Musculoskeletal       Denies muscle pain, joint pain, joint stiffness, decreased range of motion, redness, swelling, muscle weakness, and gout.  Skin       Denies bruising, unusual mles/lumps or sores, and hair/skin or nail changes.  Psych       Denies mood changes, temper/anger issues, anxiety/stress, speech problems, depression, and sleep problems. Other Comments: pt c/o LT ear pain x 2days, after swimming. she also has some pain in the RT ear. she has tried alcohol/vinegar/ water mixer in her ear, applied heating pad and taken IBF with no relief.   Past History:  Past Medical History: Reviewed history from 07/28/2010 and no changes required. Chronic Back Pain.   Depression  Past Surgical History: Reviewed history from 05/15/2009 and no changes required. Partial Hysterectomyfor bleeding 2000 Brest implants 1993 c/sec Surgical debridement for dog bit on her right 5th digit.  Tonsillectomy 1968 Tubal ligation 1993 Cyst removed from ovary 1995  Family History: Reviewed history from 12/20/2010 and no changes required. 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   Current Smoker 1/2 PPD Alcohol use-yes Drug use-no Regular exercise-no Physical Exam General appearance: well developed, well nourished, no acute distress Ears: L ear canal erythema and swelling, R canal mildly swollen.  Both TMs normal Nasal: mucosa pink, nonedematous, no septal deviation, turbinates normal Oral/Pharynx: tongue normal, posterior pharynx without erythema or exudate Chest/Lungs: no rales, wheezes, or rhonchi bilateral, breath sounds equal without effort Heart: regular rate and  rhythm, no murmur MSE: oriented to  time, place, and person Assessment New Problems: ACUTE SWIMMERS EAR (ICD-380.12)   Plan New Medications/Changes: TRAMADOL HCL 50 MG TABS (TRAMADOL HCL) 1 by mouth q6 hrs as needed for pain  #15 x 0, 02/07/2011, Hoyt Koch MD NEOMYCIN-POLYMYXIN-HC 3.5-10000-1 SOLN [facility]) 4 gtts both ears two times a day for 1 week  #QS x 0, 02/07/2011, Hoyt Koch MD  New Orders: Est. Patient Level III [phone] Planning Comments:   Start on eardrops to use this week.  Also with Tramadol since she reports a lot of pain.  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  Avoid qtips and swimming this week.  Consider a cap or ear plugs.  Follow-up with your primary care physician if not improving or if getting worse   The patient and/or caregiver has been counseled thoroughly with regard to medications prescribed including dosage, schedule, interactions, rationale for use, and possible side effects and they verbalize understanding.  Diagnoses and expected course of recovery discussed and will return if not improved as expected or if the condition worsens. Patient and/or caregiver verbalized understanding.  Prescriptions: TRAMADOL HCL 50 MG TABS (TRAMADOL HCL) 1 by mouth q6 hrs as needed for pain  #15 x 0   Entered and Authorized by:   Hoyt Koch MD   Signed by:   Hoyt Koch MD on 02/07/2011   Method used:   Print then Give to Patient   RxID:   [id number] NEOMYCIN-POLYMYXIN-HC 3.5-10000-1 SOLN (NEOMYCIN-POLYMYXIN-HC) 4 gtts both ears two times a day for 1 week  #QS x 0   Entered and Authorized by:   Hoyt Koch MD   Signed by:   Hoyt Koch MD on 02/07/2011   Method used:   Print then Give to Patient   RxID:   [id number]   Orders Added: 1)  Est. Patient Level III [40347]

## 2011-06-27 NOTE — Progress Notes (Signed)
Summary: LEFT EAR PAIN rm 4   Vital Signs:  Patient Profile:   50 Years Old Female CC:      LT ear ache and diarrhea x 1 day Height:     64.5 inches Weight:      130.25 pounds O2 Sat:      98 % O2 treatment:    Room Air Temp:     98.9 degrees F oral Pulse rate:   77 / minute Resp:     18 per minute BP sitting:   102 / 67  (left arm) Cuff size:   regular  Vitals Entered By: Clemens Catholic LPN (Dec 20, 2010 11:15 AM)                  Updated Prior Medication List: CYMBALTA 60 MG CPEP (DULOXETINE HCL) Take one tablet by mouth twice a day ESTRADIOL 1 MG TABS (ESTRADIOL) Take one tablet by mouth at bedtime KLONOPIN 0.5 MG TABS (CLONAZEPAM) tid  Current Allergies: No known allergies History of Present Illness History from: patient Chief Complaint: LT ear ache and diarrhea x 1 day History of Present Illness: Pt complains of L ear and L lateral neck pain pain.  She feels it could be muscular cause, as she rode a horse recently.  Onset: 1 day ago Description/Quality of Pain: dull and sharp Intensity of pain: 5/10. Sometimes 8/10. Was 8/10 last night. Modifying Factors:  Tried Ibuprofen, which did not help much. Had diarrhea after taking Ibuprofen.  Trauma: None Symptoms Worse with:movement. Better with: nothing Had 2 bouts of loose watery stool after taking Ibuprofen. No blood or mucus. No abdominal pain. Diarrhea resolved after stopping Ibuprofen. Denies cough,fever,URI sxs,  chest pain, or dyspnea.  REVIEW OF SYSTEMS Constitutional Symptoms      Denies fever, chills, night sweats, weight loss, weight gain, and fatigue.  Eyes       Denies change in vision, eye pain, eye discharge, glasses, contact lenses, and eye surgery. Ear/Nose/Throat/Mouth       Complains of ear pain.      Denies hearing loss/aids, change in hearing, ear discharge, dizziness, frequent runny nose, frequent nose bleeds, sinus problems, sore throat, hoarseness, and tooth pain or bleeding.   Respiratory       Complains of dry cough.      Denies productive cough, wheezing, shortness of breath, asthma, bronchitis, and emphysema/COPD.  Cardiovascular       Complains of chest pain.      Denies murmurs and tires easily with exhertion.    Gastrointestinal       Complains of stomach pain and diarrhea.      Denies nausea/vomiting, constipation, blood in bowel movements, and indigestion. Genitourniary       Denies painful urination, kidney stones, and loss of urinary control. Neurological       Denies paralysis, seizures, and fainting/blackouts. Musculoskeletal       Denies muscle pain, joint pain, joint stiffness, decreased range of motion, redness, swelling, muscle weakness, and gout.  Skin       Denies bruising, unusual mles/lumps or sores, and hair/skin or nail changes.  Psych       Complains of mood changes.      Denies temper/anger issues, anxiety/stress, speech problems, depression, and sleep problems. Other Comments: pt c/o LT ear ache radiating down into her neck and diarrhea x 1day. she has taken IBF with no help.   Past History:  Past Medical History: Reviewed history from 07/28/2010 and no changes  required. Chronic Back Pain.   Depression  Past Surgical History: Reviewed history from 05/15/2009 and no changes required. Partial Hysterectomyfor bleeding 2000 Brest implants 1993 c/sec Surgical debridement for dog bit on her right 5th digit.  Tonsillectomy 1968 Tubal ligation 1993 Cyst removed from ovary 1995  Family History: Fther -HTN, throat Ca- deceased Mom w/ osteoporosis-alive/healthy 85yo  Social History: Reviewed history from 02/18/2009 and no changes required. 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   Current Smoker Alcohol use-yes Drug use-no Regular exercise-no Physical Exam General appearance: well developed, well nourished, no acute distress Head: Nonspecific, nonreproducable, ttp L external ear and Left lateral neck. No TMJ crepitus. Eyes:  conjunctivae and lids normal Ears: normal canals and TMs. No drainage.  no lesions or deformities Nasal: mucosa pink, nonedematous, no septal deviation, turbinates normal Oral/Pharynx: tongue normal, posterior pharynx without erythema or exudate Neck: Left lateral neck: nonspecific ttp over soft tissue. No masses. Carotids WNL. No LA. No c-spine ttp or deformity. Thyroid: no nodules, masses, tenderness, or enlargement Chest/Lungs: no rales, wheezes, or rhonchi bilateral, breath sounds equal without effort. No chest wall ttp. Heart: regular rate and  rhythm, no murmur Skin: no obvious rashes or lesions Assessment New Problems: REFERRED OTOGENIC PAIN (ICD-388.72)  No evidence of infection. Likely soft tissue and/or muscular pain.   Patient Education: Patient and/or caregiver instructed in the following: Tylenol prn. heat, massage.  Plan New Medications/Changes: VICODIN 5-500 MG TABS (HYDROCODONE-ACETAMINOPHEN) 1 by mouth q 4-6 hrs as needed for severe pain  #12 x 0, 12/20/2010, Lajean Manes MD  New Orders: Est. Patient Level III [phone] Planning Comments:   Stop ibuprofen because of GI side-effects. I wrote small rx for vicodin, but I explained this is not for chronic use. Apply heat.   Pt also mentions sadness and emotional issues. She denies SI or HI. I advised that she see psychologist or psychiatrist about this.  Follow Up: Dr.Methanny within 1 week.--Advised to see ENT if still having ear/neck pain.   Diagnoses and expected course of recovery discussed and will return if not improved as expected or if the condition worsens. Patient and/or caregiver verbalized understanding.  Prescriptions: VICODIN 5-500 MG TABS (HYDROCODONE-ACETAMINOPHEN) 1 by mouth q 4-6 hrs as needed for severe pain  #12 x 0   Entered and Authorized by:   Lajean Manes MD   Signed by:   Lajean Manes MD on 12/20/2010   Method used:   Print then Give to Patient   RxID:   [id number]   Orders Added: 1)   Est. Patient Level III [95621]

## 2011-07-12 ENCOUNTER — Other Ambulatory Visit: Payer: Self-pay | Admitting: Orthopedic Surgery

## 2011-07-12 ENCOUNTER — Other Ambulatory Visit: Payer: Self-pay | Admitting: Family Medicine

## 2011-07-12 DIAGNOSIS — M25511 Pain in right shoulder: Secondary | ICD-10-CM

## 2011-07-16 ENCOUNTER — Ambulatory Visit
Admission: RE | Admit: 2011-07-16 | Discharge: 2011-07-16 | Disposition: A | Discharge status: Home / Self Care | Payer: BC Managed Care – PPO | Source: Ambulatory Visit | Attending: Orthopedic Surgery | Admitting: Orthopedic Surgery

## 2011-07-16 DIAGNOSIS — M25511 Pain in right shoulder: Secondary | ICD-10-CM

## 2011-08-16 ENCOUNTER — Other Ambulatory Visit: Payer: Self-pay | Admitting: Orthopedic Surgery

## 2011-08-16 ENCOUNTER — Ambulatory Visit
Admission: RE | Admit: 2011-08-16 | Discharge: 2011-08-16 | Disposition: A | Discharge status: Home / Self Care | Payer: BC Managed Care – PPO | Source: Ambulatory Visit | Attending: Orthopedic Surgery | Admitting: Orthopedic Surgery

## 2011-08-16 DIAGNOSIS — R52 Pain, unspecified: Secondary | ICD-10-CM

## 2011-08-29 HISTORY — PX: CERVICAL DISC SURGERY: SHX588

## 2011-09-27 ENCOUNTER — Other Ambulatory Visit: Payer: Self-pay | Admitting: Family Medicine

## 2011-09-27 NOTE — Telephone Encounter (Signed)
Needs appointment

## 2011-10-10 ENCOUNTER — Other Ambulatory Visit: Payer: Self-pay | Admitting: Orthopedic Surgery

## 2011-10-10 ENCOUNTER — Ambulatory Visit
Admission: RE | Admit: 2011-10-10 | Discharge: 2011-10-10 | Disposition: A | Discharge status: Home / Self Care | Payer: BC Managed Care – PPO | Source: Ambulatory Visit | Attending: Orthopedic Surgery | Admitting: Orthopedic Surgery

## 2011-10-10 DIAGNOSIS — Z9889 Other specified postprocedural states: Secondary | ICD-10-CM

## 2011-10-11 ENCOUNTER — Other Ambulatory Visit: Payer: Self-pay | Admitting: Orthopedic Surgery

## 2011-10-11 DIAGNOSIS — M542 Cervicalgia: Secondary | ICD-10-CM

## 2011-10-12 ENCOUNTER — Ambulatory Visit
Admission: RE | Admit: 2011-10-12 | Discharge: 2011-10-12 | Disposition: A | Discharge status: Home / Self Care | Payer: BC Managed Care – PPO | Source: Ambulatory Visit | Attending: Orthopedic Surgery | Admitting: Orthopedic Surgery

## 2011-10-12 DIAGNOSIS — M542 Cervicalgia: Secondary | ICD-10-CM

## 2011-10-28 ENCOUNTER — Other Ambulatory Visit: Payer: Self-pay | Admitting: Family Medicine

## 2011-11-25 ENCOUNTER — Other Ambulatory Visit: Payer: Self-pay | Admitting: Family Medicine

## 2011-12-22 ENCOUNTER — Other Ambulatory Visit: Payer: Self-pay | Admitting: Family Medicine

## 2011-12-29 ENCOUNTER — Ambulatory Visit (INDEPENDENT_AMBULATORY_CARE_PROVIDER_SITE_OTHER): Payer: BC Managed Care – PPO | Admitting: Family Medicine

## 2011-12-29 ENCOUNTER — Encounter: Payer: Self-pay | Admitting: Family Medicine

## 2011-12-29 VITALS — BP 96/66 | HR 80 | Ht 64.0 in | Wt 131.0 lb

## 2011-12-29 DIAGNOSIS — N951 Menopausal and female climacteric states: Secondary | ICD-10-CM

## 2011-12-29 DIAGNOSIS — M8580 Other specified disorders of bone density and structure, unspecified site: Secondary | ICD-10-CM

## 2011-12-29 DIAGNOSIS — E789 Disorder of lipoprotein metabolism, unspecified: Secondary | ICD-10-CM

## 2011-12-29 DIAGNOSIS — F39 Unspecified mood [affective] disorder: Secondary | ICD-10-CM

## 2011-12-29 DIAGNOSIS — E7889 Other lipoprotein metabolism disorders: Secondary | ICD-10-CM

## 2011-12-29 DIAGNOSIS — M899 Disorder of bone, unspecified: Secondary | ICD-10-CM

## 2011-12-29 DIAGNOSIS — M949 Disorder of cartilage, unspecified: Secondary | ICD-10-CM

## 2011-12-29 NOTE — Patient Instructions (Signed)
Smoking Cessation, Tips for Success     YOU CAN QUIT SMOKING   If you are ready to quit smoking, congratulations! You have chosen to help yourself be healthier. Cigarettes bring nicotine, tar, carbon monoxide, and other irritants into your body. Your lungs, heart, and blood vessels will be able to work better without these poisons. There are many different ways to quit smoking. Nicotine gum, nicotine patches, a nicotine inhaler, or nicotine nasal spray can help with physical craving. Hypnosis, support groups, and medicines help break the habit of smoking. Here are some tips to help you quit for good.     . Throw away all cigarettes.   . Clean and remove all ashtrays from your home, work, and car.   . On a card, write down your reasons for quitting. Carry the card with you and read it when you get the urge to smoke.   . Cleanse your body of nicotine. Drink enough water and fluids to keep your urine clear or pale yellow. Do this after quitting to flush the nicotine from your body.   . Learn to predict your moods. Do not let a bad situation be your excuse to have a cigarette. Some situations in your life might tempt you into wanting a cigarette.   . Never have "just one" cigarette. It leads to wanting another and another. Remind yourself of your decision to quit.   . Change habits associated with smoking. If you smoked while driving or when feeling stressed, try other activities to replace smoking. Stand up when drinking your coffee. Brush your teeth after eating. Sit in a different chair when you read the paper. Avoid alcohol while trying to quit, and try to drink fewer caffeinated beverages. Alcohol and caffeine may urge you to smoke.   . Avoid foods and drinks that can trigger a desire to smoke, such as sugary or spicy foods and alcohol.   . Ask people who smoke not to smoke around you.   . Have something planned to do right after eating or having a cup of coffee. Take a walk or exercise to perk you up. This will  help to keep you from overeating.   . Try a relaxation exercise to calm you down and decrease your stress. Remember, you may be tense and nervous for the first 2 weeks after you quit, but this will pass.   . Find new activities to keep your hands busy. Play with a pen, coin, or rubber band. Doodle or draw things on paper.   . Brush your teeth right after eating. This will help cut down on the craving for the taste of tobacco after meals. You can try mouthwash, too.   . Use oral substitutes, such as lemon drops, carrots, a cinnamon stick, or chewing gum, in place of cigarettes. Keep them handy so they are available when you have the urge to smoke.   . When you have the urge to smoke, try deep breathing.   . Designate your home as a nonsmoking area.   . If you are a heavy smoker, ask your caregiver about a prescription for nicotine chewing gum. It can ease your withdrawal from nicotine.   . Reward yourself. Set aside the cigarette money you save and buy yourself something nice.   . Look for support from others. Join a support group or smoking cessation program. Ask someone at home or at work to help you with your plan to quit smoking.   . Always ask   yourself, "Do I need this cigarette or is this just a reflex?" Tell yourself, "Today, I choose not to smoke," or "I do not want to smoke." You are reminding yourself of your decision to quit, even if you do smoke a cigarette.    HOW WILL I FEEL WHEN I QUIT SMOKING?     . The benefits of not smoking start within days of quitting.   . You may have symptoms of withdrawal because your body is used to nicotine (the addictive substance in cigarettes). You may crave cigarettes, be irritable, feel very hungry, cough often, get headaches, or have difficulty concentrating.   . The withdrawal symptoms are only temporary. They are strongest when you first quit but will go away within 10 to 14 days.   . When withdrawal symptoms occur, stay in control. Think about your reasons for  quitting. Remind yourself that these are signs that your body is healing and getting used to being without cigarettes.   . Remember that withdrawal symptoms are easier to treat than the major diseases that smoking can cause.   . Even after the withdrawal is over, expect periodic urges to smoke. However, these cravings are generally short-lived and will go away whether you smoke or not. Do not smoke!   . If you relapse and smoke again, do not lose hope. Most smokers quit 3 times before they are successful.   . If you relapse, do not give up! Plan ahead and think about what you will do the next time you get the urge to smoke.    LIFE AS A NONSMOKER: MAKE IT FOR A MONTH, MAKE IT FOR LIFE     Day 1: Hang this page where you will see it every day.   Day 2: Get rid of all ashtrays, matches, and lighters.   Day 3: Drink water. Breathe deeply between sips.   Day 4: Avoid places with smoke-filled air, such as bars, clubs, or the smoking section of restaurants.   Day 5: Keep track of how much money you save by not smoking.   Day 6: Avoid boredom. Keep a good book with you or go to the movies.   Day 7: Reward yourself! One week without smoking!   Day 8: Make a dental appointment to get your teeth cleaned.   Day 9: Decide how you will turn down a cigarette before it is offered to you.   Day 10: Review your reasons for quitting.   Day 11: Distract yourself. Stay active to keep your mind off smoking and to relieve tension. Take a walk, exercise, read a book, do a crossword puzzle, or try a new hobby.   Day 12: Exercise. Get off the bus before your stop or use stairs instead of escalators.   Day 13: Call on friends for support and encouragement.   Day 14: Reward yourself! Two weeks without smoking!   Day 15: Practice deep breathing exercises.   Day 16: Bet a friend that you can stay a nonsmoker.   Day 17: Ask to sit in nonsmoking sections of restaurants.   Day 18: Hang up "No Smoking" signs.   Day 19: Think of yourself as a  nonsmoker.   Day 20: Each morning, tell yourself you will not smoke.   Day 21: Reward yourself! Three weeks without smoking!   Day 22: Think of smoking in negative ways. Remember how it stains your teeth, gives you bad breath, and leaves you short of breath.   Day   23: Eat a nutritious breakfast.   Day 24:Do not relive your days as a smoker.   Day 25: Hold a pencil in your hand when talking on the telephone.   Day 26: Tell all your friends you do not smoke.   Day 27: Think about how much better food tastes.   Day 28: Remember, one cigarette is one too many.   Day 29: Take up a hobby that will keep your hands busy.   Day 30: Congratulations! One month without smoking! Give yourself a big reward.     Your caregiver can direct you to community resources or hospitals for support, which may include:   . Group support.   . Education.   . Hypnosis.   . Subliminal therapy.      Document Released: 04/08/2004 Document Revised: 06/30/2011 Document Reviewed: 04/27/2009   [employer] Patient Information 2012 [employer].

## 2011-12-29 NOTE — Progress Notes (Signed)
  Subjective:    Patient ID: Briana Kim, female    DOB: April 03, 1961, 51 y.o.   MRN: [id number]  HPI Mood D/O - Says she is stable on her regimen and says really can't afford to continue to see her psych even thought she really likes him. 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 She says she also plans on applying for disability. Are now financially she is really struggling with her healthcare problems and health care bills especially with having to shut down her business.  She has had cervical spine surgery since I last saw her. She had done in February and overall has done well. It has relieved her ear and jaw pain that she was having chronically. Initially we also thought she may have had tennis elbow and that was coming from her neck as well.  She wants to check her bloodwork for her menopausal symptoms. She is still having hot flashes. She still takes estrogen replacement therapy.   Review of Systems     Objective:   Physical Exam  Constitutional: She is oriented to person, place, and time. She appears well-developed and well-nourished.  HENT:  Head: Normocephalic and atraumatic.  Cardiovascular: Normal rate, regular rhythm and normal heart sounds.   Pulmonary/Chest: Effort normal and breath sounds normal.  Neurological: She is alert and oriented to person, place, and time.  Skin: Skin is warm and dry.  Psychiatric: She has a normal mood and affect. Her behavior is normal.          Assessment & Plan:  Mood D/O - I discussed with her that what she is on a stable regimen, which she feels she currently is on, then what she discusses it with her psychiatrist I am happy to take over prescribing her medications. I did require that I would need the last office visit her note or a letter from her psychiatrist stating what medication she is on her formal diagnosis. I do understand that currently it  very difficult for her to make a specialist co-pay and she is strapped financially.  Her triglyceridemia-due to recheck lipids it has been a year.  Tobacco abuse-she still smoking one pack per day and is not currently interested in quitting. She said she did try cut back for period of time.  Menopausal symptoms-she would like to have her hormones rechecked.  She has been told by the orthopedist that she is osteopenic. We'll check a vitamin D level today.

## 2011-12-30 ENCOUNTER — Other Ambulatory Visit: Payer: Self-pay | Admitting: Family Medicine

## 2012-01-24 LAB — COMPLETE METABOLIC PANEL WITH GFR
Albumin: 4.3 g/dL (ref 3.5–5.2)
Alkaline Phosphatase: 51 U/L (ref 39–117)
BUN: 8 mg/dL (ref 6–23)
Calcium: 9.3 mg/dL (ref 8.4–10.5)
Chloride: 107 mEq/L (ref 96–112)
GFR, Est Non African American: 70 mL/min
Glucose, Bld: 97 mg/dL (ref 70–99)
Potassium: 4.6 mEq/L (ref 3.5–5.3)
Sodium: 140 mEq/L (ref 135–145)
Total Protein: 6.7 g/dL (ref 6.0–8.3)

## 2012-01-24 LAB — LIPID PANEL
Cholesterol: 194 mg/dL (ref 0–200)
Total CHOL/HDL Ratio: 2.5 Ratio
VLDL: 32 mg/dL (ref 0–40)

## 2012-01-24 LAB — TSH: TSH: 1.738 u[IU]/mL (ref 0.350–4.500)

## 2012-02-11 IMAGING — OT DG DXA BONE DENSITY STUDY HL7
2 series · 2 of 2 positions shown · non-contrast
Comparison: None

CLINICAL DATA: 50-year-old patient, postmenopausal.  Bone mineral
density testing.

[Series 1: — · left · 1 of 1 slices shown (1 of 2)]
[im 1/1]
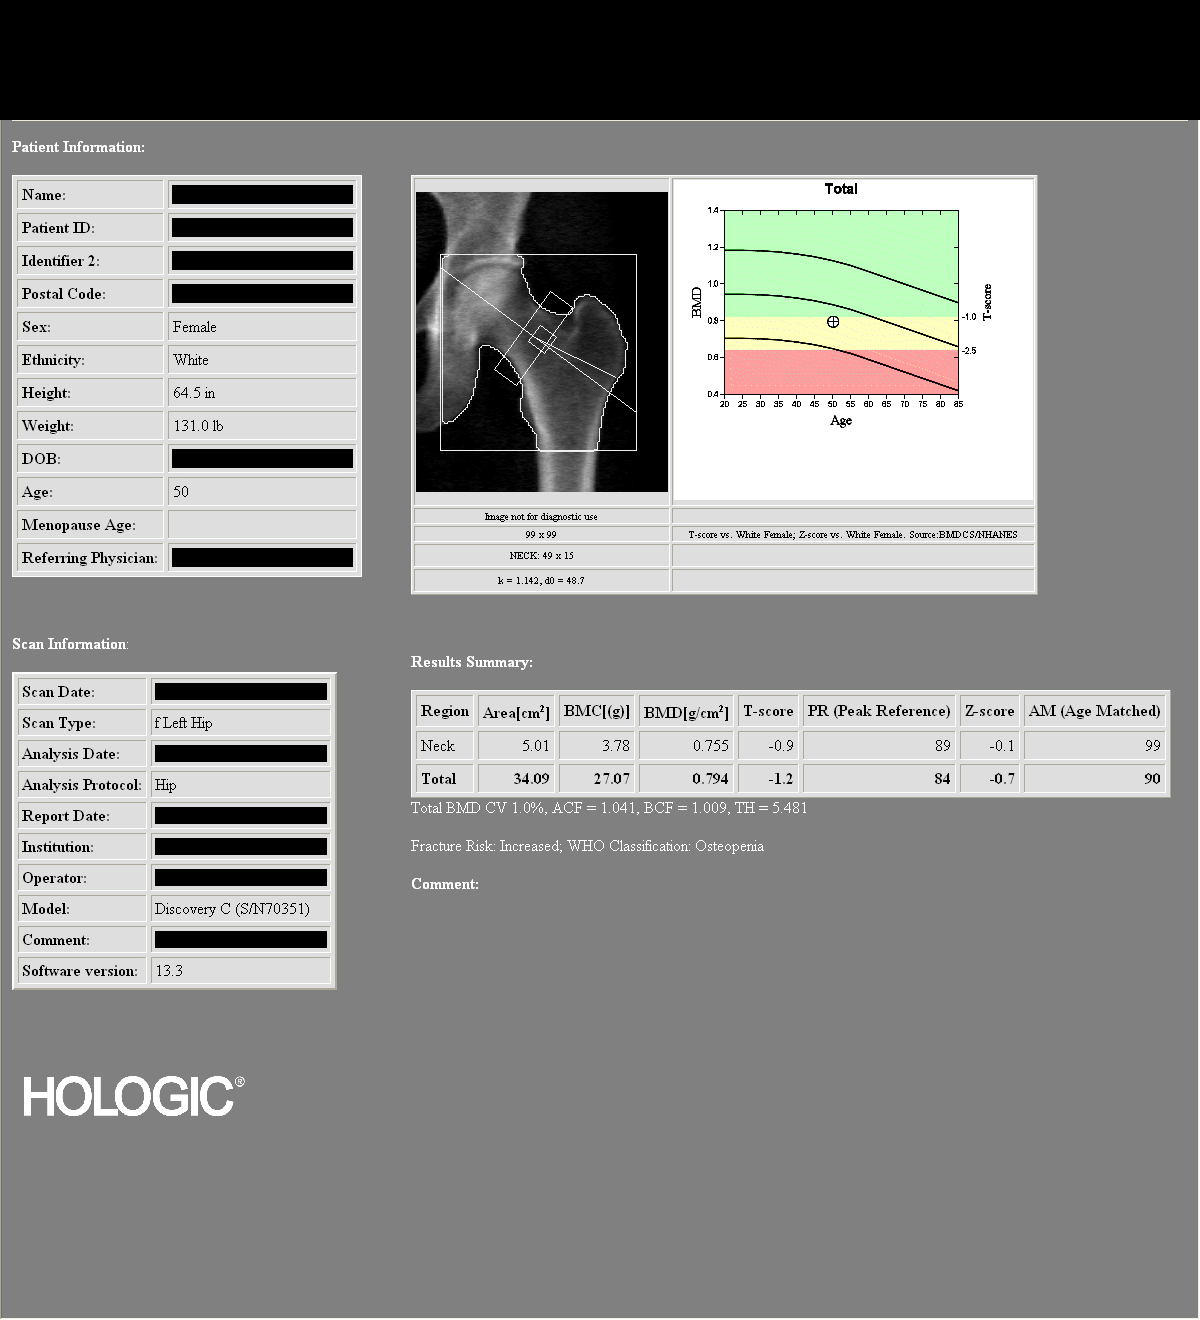

[Series 2: — · 1 of 1 slices shown (2 of 2)]
[im 1/1]
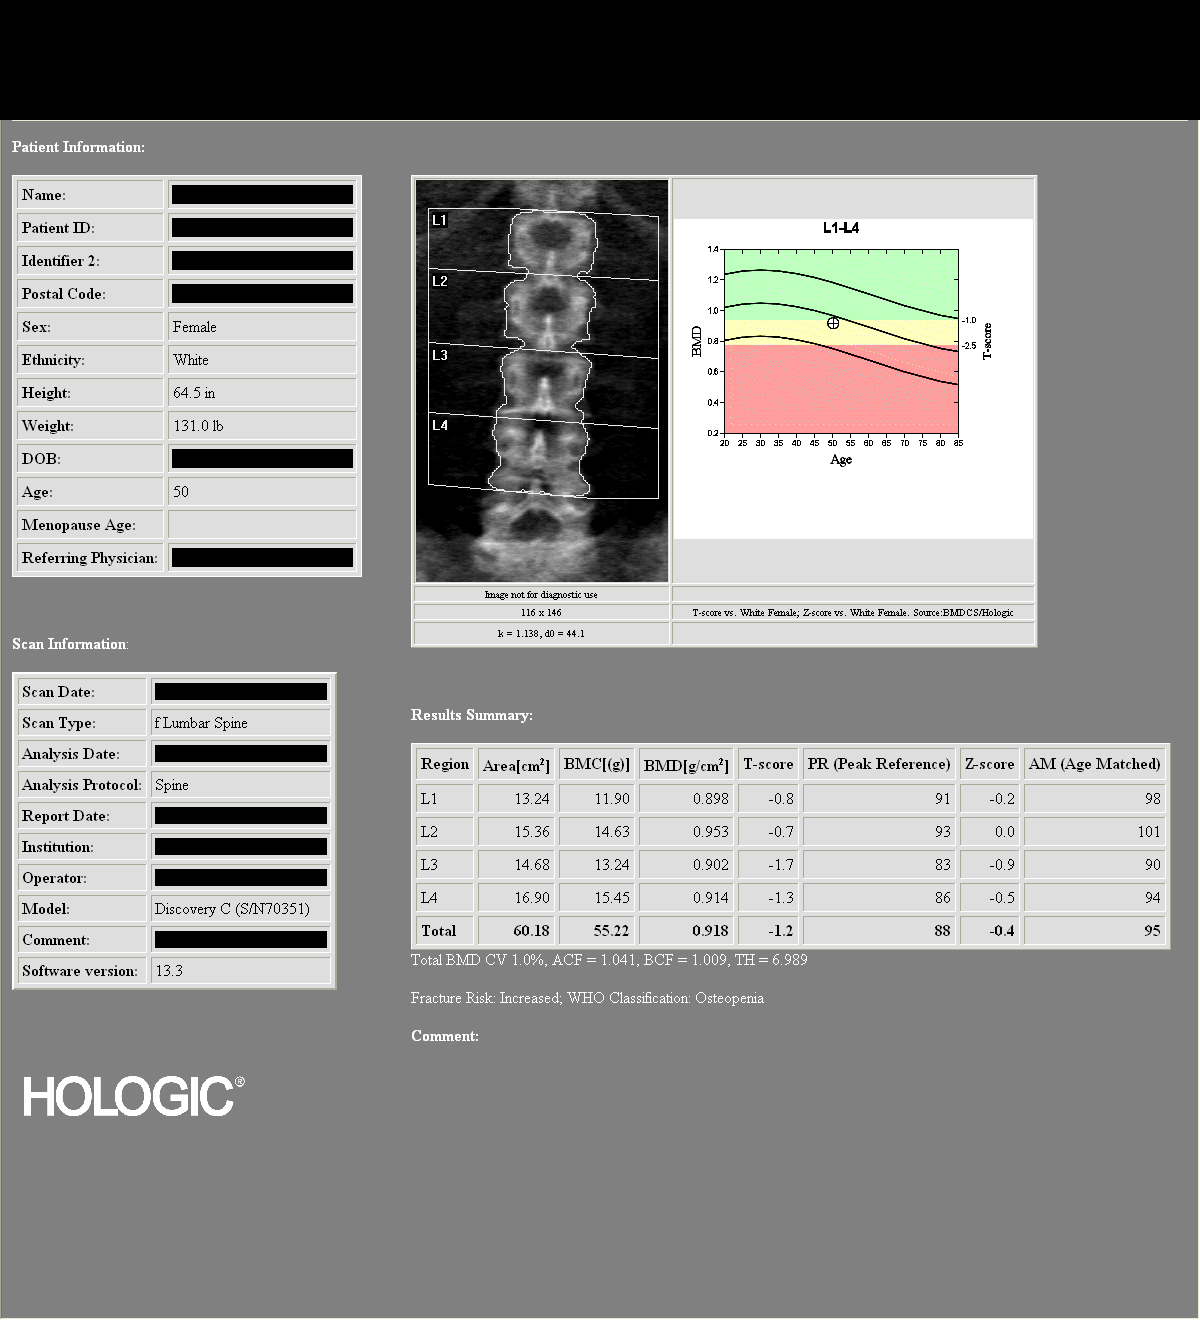

[2 of 2 positions shown; findings below may reference images not displayed]

DUAL X-RAY ABSORPTIOMETRY (DXA) FOR BONE MINERAL DENSITY

AP LUMBAR SPINE L1-L4

Bone Mineral Density (BMD):            0.918 g/cm2
Young Adult T Score:                          -1.2
Z Score:                                            -0.4

LEFT FEMUR TOTAL

Bone Mineral Density (BMD):             0.79 g/cm2
Young Adult T Score:                         -
Z Score:                                            -0.7

ASSESSMENT:  Patient's diagnostic category is LOW BONE MASS by WHO
Criteria.

FRACTURE RISK: MODERATE

FRAX: World Health Organization FRAX assessment of absolute
fracture risk is not calculated for this patient because the
patient has a history of hormone replacement therapy.
RECOMMENDATIONS:

Effective therapies are available in the form of bisphosphonates,
selective estrogen receptor modulators, biologic agents, and
hormone replacement therapy (for women).  All patients should
ensure an adequate intake of dietary calcium (1200mg daily) and
vitamin D (800 Mokhele[address] Itani) unless contraindicated.

All treatment decisions require clinical judgement and
consideration of individual patient factors, including patient
preferences, co-morbidities, previous drug use, risk factors not
captured in the FRAX model (e.g., frailty, falls, vitamin D
deficiency, increased bone turnover, interval significant decline
in bone density) and possible under-or over-estimation of fracture
risk by FRAX.

The National Osteoporosis Foundation recommends that FDA-approved
medical therapies be considered in postmenopausal women and mean
age 50 or older with a:

      1)     Hip or vertebral (clinical or morphometric) fracture.

2)    T-score of -2.5 or lower at the spine or hip.
3)    Ten-year fracture probability by FRAX of 3% or greater for
hip fracture or 20% or greater for major osteoporotic fracture.
FOLLOW-UP:

People with diagnosed cases of osteoporosis or at high risk for
fracture should have regular bone mineral density tests.  For
patients eligible for Medicare, routine testing is allowed once
every 2 years.  The testing frequency can be increased to one year
for patients who have rapidly progressing disease, those who are
receiving or discontinuing medical therapy to restore bone mass, or
have additional risk factors.

World Health Organization (WHO) Criteria:

Normal: T scores from +1.0 to -1.0
Low Bone Mass (Osteopenia): T scores between -1.0 and -2.5
Osteoporosis: T scores -2.5 and below

Comparison to Reference Population:

T score is the key measure used in the diagnosis of osteoporosis
and relative risk determination for fracture.  It provides a value
for bone mass relative to the mean bone mass of a young adult
reference population expressed in terms of standard deviation (SD).

Z score is the age-matched score showing the patient's values
compared to a population matched for age, sex, and race.  This is
also expressed in terms of standard deviation.  The patient may
have values that compare favorably to the age-matched values and
still be at increased risk for fracture.

## 2012-02-27 HISTORY — PX: SHOULDER ARTHROSCOPY WITH DEBRIDEMENT AND BICEP TENDON REPAIR: SHX5690

## 2012-03-01 ENCOUNTER — Ambulatory Visit: Payer: BC Managed Care – PPO | Attending: Orthopedic Surgery | Admitting: Physical Therapy

## 2012-03-01 ENCOUNTER — Telehealth: Payer: Self-pay | Admitting: *Deleted

## 2012-03-01 DIAGNOSIS — M25619 Stiffness of unspecified shoulder, not elsewhere classified: Secondary | ICD-10-CM | POA: Insufficient documentation

## 2012-03-01 DIAGNOSIS — M25519 Pain in unspecified shoulder: Secondary | ICD-10-CM | POA: Insufficient documentation

## 2012-03-01 DIAGNOSIS — M542 Cervicalgia: Secondary | ICD-10-CM | POA: Insufficient documentation

## 2012-03-01 DIAGNOSIS — M6281 Muscle weakness (generalized): Secondary | ICD-10-CM | POA: Insufficient documentation

## 2012-03-01 DIAGNOSIS — IMO0001 Reserved for inherently not codable concepts without codable children: Secondary | ICD-10-CM | POA: Insufficient documentation

## 2012-03-01 NOTE — Telephone Encounter (Signed)
LM for pt to returncall

## 2012-03-01 NOTE — Telephone Encounter (Signed)
Pt states she is post menopausal according to last lab work. She would like to know if she should take 2 mg Estradiol daily or should it be changed. We will need to send rx.

## 2012-03-01 NOTE — Telephone Encounter (Signed)
Is she on 2 or the 0.5?   Does she feel it is working ot not?

## 2012-03-01 NOTE — Telephone Encounter (Signed)
She is on the 2

## 2012-03-01 NOTE — Telephone Encounter (Signed)
I would stay with 2. I wouldn't go any higher. Unless she wants to do down.

## 2012-03-02 NOTE — Telephone Encounter (Signed)
LM on VM.

## 2012-03-05 ENCOUNTER — Telehealth: Payer: Self-pay | Admitting: *Deleted

## 2012-03-05 NOTE — Telephone Encounter (Signed)
Pt states she will try to go and not take the Estradiol. States if she doesn't do well then she will call back.

## 2012-03-05 NOTE — Telephone Encounter (Signed)
Pt called Friday after I left. Tried to call pt and unable to talk to her. LM on VM.

## 2012-03-06 ENCOUNTER — Encounter: Payer: BC Managed Care – PPO | Admitting: Physical Therapy

## 2012-03-07 ENCOUNTER — Other Ambulatory Visit: Payer: Self-pay | Admitting: Orthopedic Surgery

## 2012-03-07 ENCOUNTER — Ambulatory Visit (INDEPENDENT_AMBULATORY_CARE_PROVIDER_SITE_OTHER): Payer: BC Managed Care – PPO

## 2012-03-07 ENCOUNTER — Ambulatory Visit: Payer: BC Managed Care – PPO | Admitting: Physical Therapy

## 2012-03-07 DIAGNOSIS — Z09 Encounter for follow-up examination after completed treatment for conditions other than malignant neoplasm: Secondary | ICD-10-CM

## 2012-03-15 ENCOUNTER — Ambulatory Visit: Payer: BC Managed Care – PPO | Admitting: Physical Therapy

## 2012-03-19 ENCOUNTER — Ambulatory Visit: Payer: BC Managed Care – PPO | Admitting: Physical Therapy

## 2012-03-22 ENCOUNTER — Ambulatory Visit: Payer: BC Managed Care – PPO | Admitting: Physical Therapy

## 2012-03-27 ENCOUNTER — Encounter: Payer: BC Managed Care – PPO | Admitting: Physical Therapy

## 2012-03-29 ENCOUNTER — Ambulatory Visit: Payer: BC Managed Care – PPO | Attending: Orthopedic Surgery | Admitting: Physical Therapy

## 2012-03-29 DIAGNOSIS — M25519 Pain in unspecified shoulder: Secondary | ICD-10-CM | POA: Insufficient documentation

## 2012-03-29 DIAGNOSIS — M6281 Muscle weakness (generalized): Secondary | ICD-10-CM | POA: Insufficient documentation

## 2012-03-29 DIAGNOSIS — M542 Cervicalgia: Secondary | ICD-10-CM | POA: Insufficient documentation

## 2012-03-29 DIAGNOSIS — IMO0001 Reserved for inherently not codable concepts without codable children: Secondary | ICD-10-CM | POA: Insufficient documentation

## 2012-03-29 DIAGNOSIS — M25619 Stiffness of unspecified shoulder, not elsewhere classified: Secondary | ICD-10-CM | POA: Insufficient documentation

## 2012-04-04 ENCOUNTER — Ambulatory Visit: Payer: BC Managed Care – PPO | Admitting: Physical Therapy

## 2012-04-06 ENCOUNTER — Ambulatory Visit: Payer: BC Managed Care – PPO | Admitting: Physical Therapy

## 2012-04-10 ENCOUNTER — Ambulatory Visit: Payer: BC Managed Care – PPO | Admitting: Physical Therapy

## 2012-04-12 ENCOUNTER — Ambulatory Visit: Payer: BC Managed Care – PPO | Admitting: Physical Therapy

## 2012-04-16 ENCOUNTER — Ambulatory Visit: Payer: BC Managed Care – PPO | Admitting: Physical Therapy

## 2012-04-23 ENCOUNTER — Ambulatory Visit: Payer: BC Managed Care – PPO | Admitting: Physical Therapy

## 2012-04-26 ENCOUNTER — Encounter: Payer: BC Managed Care – PPO | Admitting: Physical Therapy

## 2012-04-26 ENCOUNTER — Ambulatory Visit: Payer: BC Managed Care – PPO | Attending: Orthopedic Surgery | Admitting: Physical Therapy

## 2012-04-26 DIAGNOSIS — IMO0001 Reserved for inherently not codable concepts without codable children: Secondary | ICD-10-CM | POA: Insufficient documentation

## 2012-04-26 DIAGNOSIS — M542 Cervicalgia: Secondary | ICD-10-CM | POA: Insufficient documentation

## 2012-04-26 DIAGNOSIS — M6281 Muscle weakness (generalized): Secondary | ICD-10-CM | POA: Insufficient documentation

## 2012-04-26 DIAGNOSIS — M25619 Stiffness of unspecified shoulder, not elsewhere classified: Secondary | ICD-10-CM | POA: Insufficient documentation

## 2012-04-26 DIAGNOSIS — M25519 Pain in unspecified shoulder: Secondary | ICD-10-CM | POA: Insufficient documentation

## 2012-05-02 ENCOUNTER — Ambulatory Visit: Payer: BC Managed Care – PPO | Admitting: Physical Therapy

## 2012-05-07 ENCOUNTER — Encounter: Payer: BC Managed Care – PPO | Admitting: Physical Therapy

## 2012-05-11 ENCOUNTER — Ambulatory Visit: Payer: BC Managed Care – PPO | Admitting: Physical Therapy

## 2012-05-16 ENCOUNTER — Ambulatory Visit: Payer: BC Managed Care – PPO | Admitting: Physical Therapy

## 2012-05-24 ENCOUNTER — Encounter: Payer: BC Managed Care – PPO | Admitting: Physical Therapy

## 2012-05-31 ENCOUNTER — Telehealth: Payer: Self-pay | Admitting: *Deleted

## 2012-05-31 NOTE — Telephone Encounter (Signed)
I need notes from her ortho.

## 2012-05-31 NOTE — Telephone Encounter (Signed)
Patient called and states she needs Norco qid refilled. Her Ortho said he could no longer write anymore for her Her Psych told her to call Dr Eppie Gibson She said she could not get into a pain clinic for 4-6 weeks from now and is out of he meds and she can not come for OV because she is staying with her mother and can not leave her alone.

## 2012-05-31 NOTE — Telephone Encounter (Signed)
Requested medical records from Dr, Ranell Patrick at [facility]

## 2012-06-07 ENCOUNTER — Encounter: Payer: Self-pay | Admitting: Family Medicine

## 2012-06-07 ENCOUNTER — Telehealth: Payer: Self-pay | Admitting: Family Medicine

## 2012-06-07 NOTE — Telephone Encounter (Signed)
Call pt: I got records from ortho. When is her appt with pain management?

## 2012-06-08 ENCOUNTER — Telehealth: Payer: Self-pay | Admitting: *Deleted

## 2012-06-08 MED ORDER — HYDROCODONE-ACETAMINOPHEN 10-325 MG PO TABS: 1.0000 | ORAL_TABLET | Freq: Three times a day (TID) | ORAL | Status: DC

## 2012-06-08 NOTE — Telephone Encounter (Signed)
LMOM informing pt

## 2012-06-08 NOTE — Telephone Encounter (Signed)
OK to fill. Hydrocodone 10/325 #3/day until Jan 15th, only. Then will have to see pain clinic.

## 2012-06-08 NOTE — Telephone Encounter (Signed)
Pt states she scheduled for pain clinic 08/08/2012 but is on the cancellation list. She is helping to take care of mother who is in and out of hosp/rehab facility and is a lot of pain from her neck/arm. Psych doctor told Pt to contact PCP about this.     Call Pt [phone]

## 2012-06-08 NOTE — Telephone Encounter (Signed)
Pt returning your call

## 2012-06-11 ENCOUNTER — Other Ambulatory Visit: Payer: Self-pay | Admitting: *Deleted

## 2012-06-11 NOTE — Telephone Encounter (Signed)
Patient aware of the refill.

## 2012-08-09 ENCOUNTER — Telehealth: Payer: Self-pay

## 2012-08-09 DIAGNOSIS — G894 Chronic pain syndrome: Secondary | ICD-10-CM

## 2012-08-09 NOTE — Telephone Encounter (Signed)
Briana Kim was referred to the pain clinic on August 08, 2012. She arrived the day after her appointment thinking her appointment was on January 16 th. She was not seen and was rescheduled for February 5 th. She would like her pain medications refilled until this appointment.   I called [facility] and they confirmed the events.     [facility] Burtis Junes, MD  [phone]

## 2012-08-09 NOTE — Telephone Encounter (Signed)
Ok to fill for one more month until next appt.

## 2012-08-10 MED ORDER — HYDROCODONE-ACETAMINOPHEN 10-325 MG PO TABS: 1.0000 | ORAL_TABLET | Freq: Three times a day (TID) | ORAL | Status: DC

## 2012-08-10 NOTE — Telephone Encounter (Signed)
Sent prescription

## 2012-09-04 ENCOUNTER — Ambulatory Visit (INDEPENDENT_AMBULATORY_CARE_PROVIDER_SITE_OTHER): Payer: BC Managed Care – PPO | Admitting: Family Medicine

## 2012-09-04 ENCOUNTER — Encounter: Payer: Self-pay | Admitting: Family Medicine

## 2012-09-04 VITALS — BP 90/70 | Wt 129.0 lb

## 2012-09-04 DIAGNOSIS — G894 Chronic pain syndrome: Secondary | ICD-10-CM

## 2012-09-04 DIAGNOSIS — S20219A Contusion of unspecified front wall of thorax, initial encounter: Secondary | ICD-10-CM

## 2012-09-04 MED ORDER — HYDROCODONE-ACETAMINOPHEN 10-325 MG PO TABS: 1.0000 | ORAL_TABLET | Freq: Three times a day (TID) | ORAL | Status: DC

## 2012-09-04 MED ORDER — METHOCARBAMOL 500 MG PO TABS: 500.0000 mg | ORAL_TABLET | Freq: Three times a day (TID) | ORAL | Status: DC | PRN
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
## 2012-09-07 ENCOUNTER — Telehealth: Payer: Self-pay | Admitting: *Deleted

## 2012-09-07 NOTE — Telephone Encounter (Signed)
Called pt and lmovm informing pt that until we can get the notes from the pain clinic Dr. Linford Arnold WILL NOT prescribe ANY pain meds.Loralee Pacas [place]

## 2012-09-07 NOTE — Telephone Encounter (Signed)
Pt states she is out of pain medication and she needs more. Pt was supposed to be seen at a pain clinic( dr, Oneal Grout) but they referred her somewhere else and it will take a month to get her in that clinic. I tried to call the pain clinic but apparently they are closed on Fridays. Pt notified by Archie Patten   That  Dr. Linford Arnold will need notes

## 2012-10-05 ENCOUNTER — Ambulatory Visit (INDEPENDENT_AMBULATORY_CARE_PROVIDER_SITE_OTHER): Payer: BC Managed Care – PPO

## 2012-10-05 ENCOUNTER — Other Ambulatory Visit: Payer: Self-pay | Admitting: Family Medicine

## 2012-10-05 ENCOUNTER — Ambulatory Visit (INDEPENDENT_AMBULATORY_CARE_PROVIDER_SITE_OTHER): Payer: BC Managed Care – PPO | Admitting: Family Medicine

## 2012-10-05 ENCOUNTER — Encounter: Payer: Self-pay | Admitting: Family Medicine

## 2012-10-05 VITALS — BP 126/72 | HR 61 | Temp 98.1°F | Wt 130.0 lb

## 2012-10-05 DIAGNOSIS — F1411 Cocaine abuse, in remission: Secondary | ICD-10-CM

## 2012-10-05 DIAGNOSIS — R0781 Pleurodynia: Secondary | ICD-10-CM

## 2012-10-05 DIAGNOSIS — R059 Cough, unspecified: Secondary | ICD-10-CM

## 2012-10-05 DIAGNOSIS — R079 Chest pain, unspecified: Secondary | ICD-10-CM

## 2012-10-05 DIAGNOSIS — R05 Cough: Secondary | ICD-10-CM

## 2012-10-05 DIAGNOSIS — G894 Chronic pain syndrome: Secondary | ICD-10-CM

## 2012-10-05 DIAGNOSIS — J209 Acute bronchitis, unspecified: Secondary | ICD-10-CM

## 2012-10-05 MED ORDER — DOXYCYCLINE HYCLATE 100 MG PO TABS: ORAL_TABLET | ORAL | Status: AC

## 2012-10-05 MED ORDER — DICLOFENAC SODIUM 50 MG PO TBEC: DELAYED_RELEASE_TABLET | ORAL | Status: DC

## 2012-10-05 MED ORDER — METAXALONE 800 MG PO TABS: ORAL_TABLET | ORAL | Status: DC
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
## 2013-02-11 ENCOUNTER — Encounter: Payer: Self-pay | Admitting: Family Medicine

## 2013-02-11 ENCOUNTER — Ambulatory Visit (INDEPENDENT_AMBULATORY_CARE_PROVIDER_SITE_OTHER): Payer: BC Managed Care – PPO

## 2013-02-11 ENCOUNTER — Ambulatory Visit (INDEPENDENT_AMBULATORY_CARE_PROVIDER_SITE_OTHER): Payer: BC Managed Care – PPO | Admitting: Family Medicine

## 2013-02-11 VITALS — BP 94/64 | HR 93 | Ht 64.25 in | Wt 135.0 lb

## 2013-02-11 DIAGNOSIS — N390 Urinary tract infection, site not specified: Secondary | ICD-10-CM

## 2013-02-11 DIAGNOSIS — R5383 Other fatigue: Secondary | ICD-10-CM

## 2013-02-11 DIAGNOSIS — R5381 Other malaise: Secondary | ICD-10-CM

## 2013-02-11 DIAGNOSIS — J189 Pneumonia, unspecified organism: Secondary | ICD-10-CM

## 2013-02-11 DIAGNOSIS — R509 Fever, unspecified: Secondary | ICD-10-CM

## 2013-02-11 LAB — COMPLETE METABOLIC PANEL WITH GFR
Albumin: 4 g/dL (ref 3.5–5.2)
Alkaline Phosphatase: 129 U/L — ABNORMAL HIGH (ref 39–117)
BUN: 13 mg/dL (ref 6–23)
Calcium: 9.8 mg/dL (ref 8.4–10.5)
GFR, Est Non African American: 69 mL/min
Glucose, Bld: 93 mg/dL (ref 70–99)
Potassium: 4.4 mEq/L (ref 3.5–5.3)

## 2013-02-11 LAB — CBC WITH DIFFERENTIAL/PLATELET
Basophils Absolute: 0.1 10*3/uL (ref 0.0–0.1)
Eosinophils Relative: 3 % (ref 0–5)
HCT: 35.6 % — ABNORMAL LOW (ref 36.0–46.0)
Lymphocytes Relative: 33 % (ref 12–46)
MCH: 33.4 pg (ref 26.0–34.0)
MCHC: 33.7 g/dL (ref 30.0–36.0)
MCV: 99.2 fL (ref 78.0–100.0)
Monocytes Absolute: 0.5 10*3/uL (ref 0.1–1.0)
RDW: 13.1 % (ref 11.5–15.5)
WBC: 7.4 10*3/uL (ref 4.0–10.5)

## 2013-02-11 LAB — POCT URINALYSIS DIPSTICK
Bilirubin, UA: NEGATIVE
Leukocytes, UA: NEGATIVE
Nitrite, UA: NEGATIVE
Protein, UA: NEGATIVE
pH, UA: 7

## 2013-02-11 MED ORDER — AZITHROMYCIN 250 MG PO TABS: ORAL_TABLET | ORAL | Status: DC

## 2013-02-11 NOTE — Progress Notes (Addendum)
  Subjective:    Patient ID: Briana Kim, female    DOB: June 27, 1961, 52 y.o.   MRN: [id number]  HPI Says was at home with fever to 104 for several days. Says she finally collapsed and was admitted for 4.5 days for pneumonia and kidney infection?  She went home early bc her mother was ill and then took her mother to the hospital.  She has continue to have pain on her right side for about 2.5 months.  Still some pain in her low back.  Says pain with urination is still there but not gone.  She has been smoking twice as much since her mother has been sick.  She has become her mothers preimary care take.   She has a dry cough.  She is worried that it is no longer productive.   Note: hospt records show a right basilar pneumonia and pyelonephritis.  D/C summary was not dictated and available at this time.   Still having right lower rib pain - has been getting injections near the area from her pain management doc for painrelief. They really haven't helped.   Review of Systems     Objective:   Physical Exam  Constitutional: She is oriented to person, place, and time. She appears well-developed and well-nourished.  HENT:  Head: Normocephalic and atraumatic.  Cardiovascular: Normal rate, regular rhythm and normal heart sounds.   Pulmonary/Chest: Effort normal and breath sounds normal.  Abdominal: Soft. Bowel sounds are normal. She exhibits no distension and no mass. There is no tenderness. There is no rebound and no guarding.  Mild tenderness in the right lower quadrant and left lower quadrant  Musculoskeletal: She exhibits no edema.  Neurological: She is alert and oriented to person, place, and time.  Skin: Skin is warm and dry.  Psychiatric: She has a normal mood and affect. Her behavior is normal.          Assessment & Plan:  Pneumonia, right bailar-she completed her antibiotics about 2 weeks ago. Will repeat chest x-ray today to make sure infection has cleared. She still feels  extremely fatigued like she still may have and unresolved infection. She's not having any fevers.  Pylonephritis - UA is neg toda but will send cultue to confirm.  Completed antibiotics.    She does follow with pain management for her chronic pain.  Tobacco abuse-I strong suspect she has some early COPD. Encouraged to come back in a month for spirometry for further evaluation. She actually may benefit from a daily preventative inhaler. She's actually increased her smoking so she started taking care of her mom encouraged her to work on cutting back.  Eelvated alk phos- had a normal bone scan except for inc uptake at site of old fractured rib where she does have pain.

## 2013-02-13 ENCOUNTER — Telehealth: Payer: Self-pay

## 2013-02-13 NOTE — Telephone Encounter (Signed)
Returned pt's call she was wanting to find out about her urine cx results. She can be reached @  [phone].Briana Kim [place]

## 2013-02-13 NOTE — Telephone Encounter (Signed)
Ellaina would like a call back from [place]. She would not say why she was calling.

## 2013-02-14 NOTE — Telephone Encounter (Signed)
Pt informed of results.Briana Kim [place]  

## 2013-02-14 NOTE — Telephone Encounter (Signed)
lvm for return call to inform pt that her cx was neg.Briana Kim [place]

## 2013-02-18 ENCOUNTER — Telehealth: Payer: Self-pay | Admitting: *Deleted

## 2013-02-18 MED ORDER — AZITHROMYCIN 250 MG PO TABS: ORAL_TABLET | ORAL | Status: DC

## 2013-02-18 NOTE — Telephone Encounter (Signed)
Pt calls and states was in hospital with UTI, pnuemonia and kidney infection.  Still has right sided pain, hurts in right lung, pain with urination, low back pain.  She wants to know if you would call her in another round of antibitics as her mother is in hospital for 7 days.  Finished the first round of antibiotics today- she missed a couple days taking care of her mom. Barry Dienes, LPN

## 2013-02-18 NOTE — Telephone Encounter (Signed)
Will call in 2nd round but if not better then needs to f/u

## 2013-02-19 NOTE — Telephone Encounter (Signed)
Left detailed message on VM of new med sent. Barry Dienes, LPN

## 2013-03-11 ENCOUNTER — Ambulatory Visit: Payer: BC Managed Care – PPO | Admitting: Family Medicine

## 2013-08-21 ENCOUNTER — Other Ambulatory Visit: Payer: Self-pay | Admitting: Physician Assistant

## 2013-08-21 ENCOUNTER — Other Ambulatory Visit: Payer: Self-pay | Admitting: Specialist

## 2013-08-21 ENCOUNTER — Ambulatory Visit (INDEPENDENT_AMBULATORY_CARE_PROVIDER_SITE_OTHER): Payer: BC Managed Care – PPO

## 2013-08-21 DIAGNOSIS — IMO0002 Reserved for concepts with insufficient information to code with codable children: Secondary | ICD-10-CM

## 2013-08-21 DIAGNOSIS — M25559 Pain in unspecified hip: Secondary | ICD-10-CM

## 2013-08-21 DIAGNOSIS — M545 Low back pain, unspecified: Secondary | ICD-10-CM

## 2013-08-23 ENCOUNTER — Other Ambulatory Visit: Payer: Self-pay | Admitting: Specialist

## 2013-08-23 ENCOUNTER — Ambulatory Visit (INDEPENDENT_AMBULATORY_CARE_PROVIDER_SITE_OTHER): Payer: BC Managed Care – PPO

## 2013-08-23 ENCOUNTER — Ambulatory Visit: Payer: BC Managed Care – PPO

## 2013-08-23 DIAGNOSIS — M542 Cervicalgia: Secondary | ICD-10-CM

## 2014-07-08 ENCOUNTER — Ambulatory Visit (INDEPENDENT_AMBULATORY_CARE_PROVIDER_SITE_OTHER): Payer: BC Managed Care – PPO | Admitting: Family Medicine

## 2014-07-08 ENCOUNTER — Telehealth: Payer: Self-pay | Admitting: *Deleted

## 2014-07-08 ENCOUNTER — Ambulatory Visit (INDEPENDENT_AMBULATORY_CARE_PROVIDER_SITE_OTHER): Payer: BC Managed Care – PPO

## 2014-07-08 ENCOUNTER — Encounter: Payer: Self-pay | Admitting: Family Medicine

## 2014-07-08 VITALS — BP 116/81 | HR 91 | Ht 64.25 in | Wt 135.0 lb

## 2014-07-08 DIAGNOSIS — N644 Mastodynia: Secondary | ICD-10-CM | POA: Diagnosis not present

## 2014-07-08 DIAGNOSIS — R0781 Pleurodynia: Secondary | ICD-10-CM

## 2014-07-08 DIAGNOSIS — R079 Chest pain, unspecified: Secondary | ICD-10-CM

## 2014-07-08 MED ORDER — GABAPENTIN 300 MG PO CAPS: 300.0000 mg | ORAL_CAPSULE | Freq: Three times a day (TID) | ORAL | Status: DC

## 2014-07-08 MED ORDER — ALBUTEROL SULFATE HFA 108 (90 BASE) MCG/ACT IN AERS: INHALATION_SPRAY | RESPIRATORY_TRACT | Status: DC

## 2014-07-08 MED ORDER — HYDROCODONE-ACETAMINOPHEN 5-325 MG PO TABS: 1.0000 | ORAL_TABLET | Freq: Four times a day (QID) | ORAL | Status: DC | PRN
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
## 2014-07-08 NOTE — Telephone Encounter (Signed)
Pt called and lvm asking about whether or not dr. Linford Arnoldmetheney is going to send an Rx for prednisone to her pharmacy. I left a VM informing her that she is NOT going to write for this.Loralee PacasB[place]ley, Jaycob Mcclenton [place]etta

## 2014-07-08 NOTE — Patient Instructions (Signed)
Take 1 aleve BID, continue the heat since helping.  No heavy lifting.   Follow up with breast center if still having pain in the breast in 2 weeks.

## 2014-07-25 HISTORY — PX: BREAST ENHANCEMENT SURGERY: SHX7

## 2014-09-02 ENCOUNTER — Telehealth: Payer: Self-pay | Admitting: Family Medicine

## 2014-09-02 ENCOUNTER — Encounter: Payer: Self-pay | Admitting: Family Medicine

## 2014-09-02 ENCOUNTER — Ambulatory Visit (INDEPENDENT_AMBULATORY_CARE_PROVIDER_SITE_OTHER): Payer: BC Managed Care – PPO | Admitting: Family Medicine

## 2014-09-02 VITALS — BP 121/76 | HR 96 | Wt 131.0 lb

## 2014-09-02 DIAGNOSIS — X58XXXA Exposure to other specified factors, initial encounter: Secondary | ICD-10-CM

## 2014-09-02 DIAGNOSIS — R0602 Shortness of breath: Secondary | ICD-10-CM

## 2014-09-02 DIAGNOSIS — T8549XA Other mechanical complication of breast prosthesis and implant, initial encounter: Secondary | ICD-10-CM

## 2014-09-02 DIAGNOSIS — N644 Mastodynia: Secondary | ICD-10-CM

## 2014-09-02 MED ORDER — OMEPRAZOLE 40 MG PO CPDR: 40.0000 mg | DELAYED_RELEASE_CAPSULE | Freq: Every day | ORAL | Status: AC

## 2014-09-02 MED ORDER — ALBUTEROL SULFATE HFA 108 (90 BASE) MCG/ACT IN AERS: INHALATION_SPRAY | RESPIRATORY_TRACT | Status: AC

## 2014-09-02 MED ORDER — HYDROCODONE-ACETAMINOPHEN 5-325 MG PO TABS: 1.0000 | ORAL_TABLET | Freq: Four times a day (QID) | ORAL | Status: DC | PRN

## 2014-09-02 MED ORDER — GI COCKTAIL ~~LOC~~
30.0000 mL | Freq: Once | ORAL | Status: AC
  Administered 2014-09-02: 30 mL via ORAL

## 2014-09-02 MED ORDER — CLONAZEPAM 0.5 MG PO TABS: 0.5000 mg | ORAL_TABLET | Freq: Two times a day (BID) | ORAL | Status: DC

## 2014-09-02 MED ORDER — KETOROLAC TROMETHAMINE 60 MG/2ML IM SOLN
60.0000 mg | Freq: Once | INTRAMUSCULAR | Status: AC
  Administered 2014-09-02: 60 mg via INTRAMUSCULAR

## 2014-09-02 MED ORDER — PROMETHAZINE HCL 25 MG/ML IJ SOLN
25.0000 mg | Freq: Once | INTRAMUSCULAR | Status: AC
  Administered 2014-09-02: 25 mg via INTRAMUSCULAR

## 2014-09-02 NOTE — Telephone Encounter (Signed)
rx sent.Briana Kim  

## 2014-09-02 NOTE — Telephone Encounter (Signed)
Patient request to get a refill for Albuterol called into [facility]. Thanks
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
## 2014-09-03 ENCOUNTER — Ambulatory Visit
Admission: RE | Admit: 2014-09-03 | Discharge: 2014-09-03 | Disposition: A | Discharge status: Home / Self Care | Payer: BC Managed Care – PPO | Source: Ambulatory Visit | Attending: Family Medicine | Admitting: Family Medicine

## 2014-09-03 DIAGNOSIS — T8549XA Other mechanical complication of breast prosthesis and implant, initial encounter: Secondary | ICD-10-CM

## 2014-09-03 DIAGNOSIS — N644 Mastodynia: Secondary | ICD-10-CM

## 2014-09-03 LAB — HEPATITIS PANEL, ACUTE
HCV Ab: NEGATIVE
Hep A IgM: NONREACTIVE
Hep B C IgM: NONREACTIVE
Hepatitis B Surface Ag: NEGATIVE

## 2014-09-03 LAB — HIV ANTIBODY (ROUTINE TESTING W REFLEX): HIV: NONREACTIVE

## 2014-09-03 LAB — RPR

## 2014-09-11 ENCOUNTER — Telehealth: Payer: Self-pay

## 2014-09-11 NOTE — Telephone Encounter (Signed)
No refilll on hydrocodone. Can refill with tramadol if needed. Ok to refill gabapentin early

## 2014-09-11 NOTE — Telephone Encounter (Signed)
Briana Kim states she left her gabapentin out of town and will need an early refill. She also needs more hydrocodone to last her until her appointment with the plastic surgeon. She is schedule to see the surgeon on Feb. 24 th. Please advise.

## 2014-09-12 MED ORDER — TRAMADOL HCL 50 MG PO TABS: 50.0000 mg | ORAL_TABLET | Freq: Three times a day (TID) | ORAL | Status: DC | PRN

## 2014-09-12 NOTE — Telephone Encounter (Signed)
Patient agrees to take the tramadol. I have called [facility] to ok early refill on gabapentin.

## 2014-09-12 NOTE — Telephone Encounter (Signed)
rx printed. Ok ot fax.

## 2014-09-30 ENCOUNTER — Other Ambulatory Visit: Payer: Self-pay | Admitting: Family Medicine

## 2014-11-10 ENCOUNTER — Telehealth: Payer: Self-pay

## 2014-11-10 NOTE — Telephone Encounter (Signed)
She is welcome to make an appt. Any abnormal weight loss?

## 2014-11-10 NOTE — Telephone Encounter (Signed)
Patient scheduled.

## 2014-11-10 NOTE — Telephone Encounter (Signed)
Briana Kim called and reports a return of night sweats for the last 7 weeks. Denies other symptoms, such as; fever, chills, cough or wound abscess from breast surgery.

## 2014-11-14 ENCOUNTER — Ambulatory Visit (INDEPENDENT_AMBULATORY_CARE_PROVIDER_SITE_OTHER): Payer: BC Managed Care – PPO | Admitting: Family Medicine

## 2014-11-14 ENCOUNTER — Encounter: Payer: Self-pay | Admitting: Family Medicine

## 2014-11-14 VITALS — BP 104/69 | HR 77 | Ht 64.0 in | Wt 137.0 lb

## 2014-11-14 DIAGNOSIS — N951 Menopausal and female climacteric states: Secondary | ICD-10-CM | POA: Diagnosis not present

## 2014-11-14 DIAGNOSIS — R61 Generalized hyperhidrosis: Secondary | ICD-10-CM | POA: Diagnosis not present

## 2014-11-14 DIAGNOSIS — Z13220 Encounter for screening for lipoid disorders: Secondary | ICD-10-CM | POA: Diagnosis not present

## 2014-11-14 DIAGNOSIS — J301 Allergic rhinitis due to pollen: Secondary | ICD-10-CM

## 2014-11-14 DIAGNOSIS — R232 Flushing: Secondary | ICD-10-CM

## 2014-11-14 DIAGNOSIS — M25511 Pain in right shoulder: Secondary | ICD-10-CM

## 2014-11-14 MED ORDER — TRAMADOL HCL 50 MG PO TABS: 50.0000 mg | ORAL_TABLET | Freq: Three times a day (TID) | ORAL | Status: DC | PRN

## 2014-11-14 MED ORDER — ESTRADIOL 0.5 MG PO TABS: 0.5000 mg | ORAL_TABLET | Freq: Every day | ORAL | Status: AC

## 2014-11-14 NOTE — Progress Notes (Signed)
   Subjective:    Patient ID: Briana Kim, female    DOB: 10/15/1960, 54 y.o.   MRN: [id number]  HPI Has been having hotflashes at night  She still has her ovaries.  She also wants her cholesterol, etc.  Sh really wants to get back on her estrogen. They seem to have calmed down on their own and she had discontinued her estrogen but for some reason of the last 6 weeks they have really started flaring again.  Having problems with allergies. She has been coughing.  Says has been congested.  [facility].  She says her breathing has been fine. Her ears have been popping and she has been congested.  She says the coughing is aggravated her right shoulder pain. She has rotator cuff issue.  The last time I saw her she was having a significant amount of breast pain and had some deformity of her breast implants. We got her in with the plastic surgery in and since then they redid her implants. She was having some leakage of saline which I think was in part making her feel poorly. She said today she feels much better and back to her old self.   Review of Systems     Objective:   Physical Exam  Constitutional: She is oriented to person, place, and time. She appears well-developed and well-nourished.  HENT:  Head: Normocephalic and atraumatic.  Right Ear: External ear normal.  Left Ear: External ear normal.  Nose: Nose normal.  Mouth/Throat: Oropharynx is clear and moist.  TMs and canals are clear.   Eyes: Conjunctivae and EOM are normal. Pupils are equal, round, and reactive to light.  Neck: Neck supple. No thyromegaly present.  Cardiovascular: Normal rate, regular rhythm and normal heart sounds.   Pulmonary/Chest: Effort normal and breath sounds normal. She has no wheezes.  Lymphadenopathy:    She has no cervical adenopathy.  Neurological: She is alert and oriented to person, place, and time.  Skin: Skin is warm and dry.  Psychiatric: She has a normal mood and  affect.          Assessment & Plan:  Hotflashes - Discussed non-hormonal tx.  She really wants to know restart estrogen.  Reminded her again about the potential risk for estrogen and hormone replacement therapy. She has been on before. F/U in 3 month.  Will recheck hormones as she feels like she still ovulates.   AR- Recommend trial of nasal steroid spray. She is [facility].    Right shoulder pain - refilled her tamadol today.

## 2014-11-18 LAB — COMPLETE METABOLIC PANEL WITH GFR
ALT: 11 U/L (ref 0–35)
AST: 21 U/L (ref 0–37)
Albumin: 3.7 g/dL (ref 3.5–5.2)
Alkaline Phosphatase: 66 U/L (ref 39–117)
BUN: 9 mg/dL (ref 6–23)
CALCIUM: 9.3 mg/dL (ref 8.4–10.5)
CO2: 24 mEq/L (ref 19–32)
Chloride: 102 mEq/L (ref 96–112)
Creat: 0.88 mg/dL (ref 0.50–1.10)
GFR, EST AFRICAN AMERICAN: 86 mL/min
GFR, Est Non African American: 75 mL/min
Glucose, Bld: 93 mg/dL (ref 70–99)
POTASSIUM: 4.5 meq/L (ref 3.5–5.3)
SODIUM: 141 meq/L (ref 135–145)
Total Bilirubin: 0.2 mg/dL (ref 0.2–1.2)
Total Protein: 6.2 g/dL (ref 6.0–8.3)

## 2014-11-18 LAB — LIPID PANEL
Cholesterol: 192 mg/dL (ref 0–200)
HDL: 105 mg/dL (ref >=46)
LDL Cholesterol: 70 mg/dL (ref 0–99)
Total CHOL/HDL Ratio: 1.8 Ratio
Triglycerides: 86 mg/dL (ref <150)
VLDL: 17 mg/dL (ref 0–40)

## 2014-11-18 LAB — TSH: TSH: 3.758 u[IU]/mL (ref 0.350–4.500)

## 2014-11-19 LAB — ESTRADIOL, FREE

## 2014-11-20 LAB — ESTRADIOL: ESTRADIOL: 18.9 pg/mL

## 2014-11-20 LAB — PROGESTERONE: Progesterone: 0.2 ng/mL

## 2014-11-20 LAB — LUTEINIZING HORMONE: LH: 25.5 m[IU]/mL

## 2014-11-20 LAB — FOLLICLE STIMULATING HORMONE: FSH: 66.3 m[IU]/mL

## 2015-06-01 ENCOUNTER — Encounter: Payer: Self-pay | Admitting: Family Medicine

## 2015-06-01 ENCOUNTER — Ambulatory Visit (INDEPENDENT_AMBULATORY_CARE_PROVIDER_SITE_OTHER): Payer: BC Managed Care – PPO | Admitting: Family Medicine

## 2015-06-01 VITALS — BP 115/74 | HR 74 | Temp 98.4°F | Wt 142.9 lb

## 2015-06-01 DIAGNOSIS — H6983 Other specified disorders of Eustachian tube, bilateral: Secondary | ICD-10-CM | POA: Diagnosis not present

## 2015-06-01 DIAGNOSIS — H9203 Otalgia, bilateral: Secondary | ICD-10-CM | POA: Diagnosis not present

## 2015-06-01 MED ORDER — FLUTICASONE PROPIONATE 50 MCG/ACT NA SUSP: 2.0000 | Freq: Every day | NASAL | Status: AC

## 2015-06-01 NOTE — Progress Notes (Signed)
   Subjective:    Patient ID: Briana Kim, female    DOB: 05/15/1961, 54 y.o.   MRN: [id number]  HPI Bilateral ear pain since June.  Left worse than the elft. No ear drainage.  Now feels like the pain is radiating into her throat.  No sinus pressure or pain. No significant nasal congestion or headache. No fevers chills or sweats. No drainage from the ears. She says it started after she went swimming with some friends.   Review of Systems     Objective:   Physical Exam  Constitutional: She is oriented to person, place, and time. She appears well-developed and well-nourished.  HENT:  Head: Normocephalic and atraumatic.  Right Ear: External ear normal.  Left Ear: External ear normal.  Nose: Nose normal.  Mouth/Throat: Oropharynx is clear and moist.  TMs and canals are clear.   Eyes: Conjunctivae and EOM are normal. Pupils are equal, round, and reactive to light.  Neck: Neck supple. No thyromegaly present.  Cardiovascular: Normal rate, regular rhythm and normal heart sounds.   Pulmonary/Chest: Effort normal and breath sounds normal. She has no wheezes.  Lymphadenopathy:    She has no cervical adenopathy.  Neurological: She is alert and oriented to person, place, and time.  Skin: Skin is warm and dry.  Psychiatric: She has a normal mood and affect.          Assessment & Plan:  Bilateral otalgia-most consistent with eustachian tube dysfunction. Ear exam itself is completely normal in tympanometry was normal bilaterally as well. Gave reassurance. Consider trial of prednisone or nasal steroid spray. Patient opted to try the nasal steroid spray first. She says prednisone makes her very jittery and anxious. If she's not improving over the next couple weeks then consider referral to ENT for further evaluation.

## 2016-06-18 ENCOUNTER — Other Ambulatory Visit: Payer: Self-pay | Admitting: Family Medicine
